# Patient Record
Sex: Female | Born: 1990 | Race: White | Hispanic: No | Marital: Single | Smoking: Never smoker
Health system: Southern US, Community
[De-identification: ages and names within clinical notes are randomized; demographics above are authoritative.]

## PROBLEM LIST (undated history)

## (undated) DIAGNOSIS — E559 Vitamin D deficiency, unspecified: Secondary | ICD-10-CM

## (undated) DIAGNOSIS — N809 Endometriosis, unspecified: Secondary | ICD-10-CM

## (undated) HISTORY — DX: Vitamin D deficiency, unspecified: E55.9

## (undated) HISTORY — PX: APPENDECTOMY: SHX54

## (undated) HISTORY — PX: BACK SURGERY: SHX140

## (undated) HISTORY — PX: EXCISION OF ENDOMETRIOMA: SHX6473

## (undated) HISTORY — DX: Endometriosis, unspecified: N80.9

---

## 2013-03-29 ENCOUNTER — Emergency Department (HOSPITAL_BASED_OUTPATIENT_CLINIC_OR_DEPARTMENT_OTHER)
Admission: EM | Admit: 2013-03-29 | Discharge: 2013-03-29 | Disposition: A | Discharge status: Home / Self Care | Payer: 59 | Attending: Emergency Medicine | Admitting: Emergency Medicine

## 2013-03-29 ENCOUNTER — Encounter (HOSPITAL_BASED_OUTPATIENT_CLINIC_OR_DEPARTMENT_OTHER): Payer: Self-pay | Admitting: Emergency Medicine

## 2013-03-29 ENCOUNTER — Emergency Department (HOSPITAL_BASED_OUTPATIENT_CLINIC_OR_DEPARTMENT_OTHER): Payer: 59

## 2013-03-29 DIAGNOSIS — Y929 Unspecified place or not applicable: Secondary | ICD-10-CM | POA: Insufficient documentation

## 2013-03-29 DIAGNOSIS — Y9389 Activity, other specified: Secondary | ICD-10-CM | POA: Insufficient documentation

## 2013-03-29 DIAGNOSIS — S93401A Sprain of unspecified ligament of right ankle, initial encounter: Secondary | ICD-10-CM

## 2013-03-29 DIAGNOSIS — S93409A Sprain of unspecified ligament of unspecified ankle, initial encounter: Secondary | ICD-10-CM | POA: Insufficient documentation

## 2013-03-29 DIAGNOSIS — Z79899 Other long term (current) drug therapy: Secondary | ICD-10-CM | POA: Insufficient documentation

## 2013-03-29 DIAGNOSIS — Z791 Long term (current) use of non-steroidal anti-inflammatories (NSAID): Secondary | ICD-10-CM | POA: Insufficient documentation

## 2013-03-29 DIAGNOSIS — X500XXA Overexertion from strenuous movement or load, initial encounter: Secondary | ICD-10-CM | POA: Insufficient documentation

## 2013-03-29 MED ORDER — IBUPROFEN 800 MG PO TABS
800.0000 mg | ORAL_TABLET | Freq: Once | ORAL | Status: AC
  Administered 2013-03-29: 800 mg via ORAL
  Filled 2013-03-29: qty 1

## 2013-03-29 MED ORDER — HYDROCODONE-ACETAMINOPHEN 5-325 MG PO TABS: 2.0000 | ORAL_TABLET | ORAL | Status: DC | PRN

## 2013-03-29 MED ORDER — HYDROCODONE-ACETAMINOPHEN 5-325 MG PO TABS
1.0000 | ORAL_TABLET | Freq: Once | ORAL | Status: AC
  Administered 2013-03-29: 1 via ORAL
  Filled 2013-03-29: qty 1

## 2013-03-29 MED ORDER — IBUPROFEN 800 MG PO TABS: 800.0000 mg | ORAL_TABLET | Freq: Three times a day (TID) | ORAL | Status: DC

## 2013-03-29 NOTE — ED Notes (Signed)
Recheck HR prior to discharge-92, regular.

## 2013-03-29 NOTE — ED Provider Notes (Signed)
CSN: [id number]     Arrival date & time 03/29/13  1849 History   First MD Initiated Contact with Patient 03/29/13 1957     Chief Complaint  Patient presents with  . Ankle Pain   (Consider location/radiation/quality/duration/timing/severity/associated sxs/prior Treatment) Patient is a 22 y.o. female presenting with ankle pain. The history is provided by the patient.  Ankle Pain Location:  Ankle Injury: yes   Ankle location:  R ankle Pain details:    Quality:  Aching   Radiates to:  Does not radiate   Severity:  Moderate   Onset quality:  Gradual   Timing:  Constant   Progression:  Worsening Chronicity:  New Dislocation: no   Foreign body present:  No foreign bodies Tetanus status:  Out of date Relieved by:  Nothing Worsened by:  Nothing tried Pt reports she turned foot sideways.   Pt complains of swelling and pain  History reviewed. No pertinent past medical history. Past Surgical History  Procedure Laterality Date  . Tonsillectomy     History reviewed. No pertinent family history. History  Substance Use Topics  . Smoking status: Never Smoker   . Smokeless tobacco: Not on file  . Alcohol Use: Yes     Comment: occ   OB History   Grav Para Term Preterm Abortions TAB SAB Ect Mult Living                 Review of Systems  All other systems reviewed and are negative.    Allergies  Review of patient's allergies indicates no known allergies.  Home Medications   Current Outpatient Rx  Name  Route  Sig  Dispense  Refill  . cefdinir (OMNICEF) 300 MG capsule   Oral   Take 300 mg by mouth 2 (two) times daily.         . medroxyPROGESTERone (DEPO-PROVERA) 150 MG/ML injection   Intramuscular   Inject 150 mg into the muscle every 3 (three) months.         . traZODone (DESYREL) 100 MG tablet   Oral   Take 25 mg by mouth at bedtime.         Marland Kitchen ibuprofen (ADVIL,MOTRIN) 800 MG tablet   Oral   Take 1 tablet (800 mg total) by mouth 3 (three) times daily.   21  tablet   0    BP 145/72  Pulse 123  Temp(Src) 98.7 F (37.1 C) (Oral)  Resp 18  SpO2 100% Physical Exam  Nursing note and vitals reviewed. Constitutional: She is oriented to person, place, and time. She appears well-developed and well-nourished.  HENT:  Head: Normocephalic.  Musculoskeletal: She exhibits tenderness.  Tender right ankle  nv and ns intact from  Neurological: She is alert and oriented to person, place, and time. She has normal reflexes.  Skin: Skin is warm.    ED Course  Procedures (including critical care time) Labs Review Labs Reviewed - No data to display Imaging Review Dg Ankle Complete Right  03/29/2013   CLINICAL DATA:  Right ankle injury and pain.  EXAM: RIGHT ANKLE - COMPLETE 3+ VIEW  COMPARISON:  None  FINDINGS: There is no evidence of acute fracture, subluxation, or dislocation.  No focal bony lesions are identified.  There is no evidence of radiopaque foreign body.  The joint spaces are unremarkable.  IMPRESSION: Negative.   Electronically Signed   By: Laveda Abbe M.D.   On: 03/29/2013 19:48    EKG Interpretation   None  MDM   1. Right ankle sprain, initial encounter    aso and crutches,      Elson Areas, PA-C 03/29/13 2036

## 2013-03-29 NOTE — ED Notes (Signed)
Right ankle "gave out" and foot was sideways, swelling noted to site

## 2013-03-29 NOTE — ED Provider Notes (Signed)
Medical screening examination/treatment/procedure(s) were performed by non-physician practitioner and as supervising physician I was immediately available for consultation/collaboration.  EKG Interpretation   None        Ethelda Chick, MD 03/29/13 2038

## 2014-11-18 IMAGING — CR DG ANKLE COMPLETE 3+V*R*
3 series · 3 of 3 positions shown · non-contrast
Comparison: None

CLINICAL DATA: Right ankle injury and pain.

EXAM:
RIGHT ANKLE - COMPLETE 3+ VIEW

[t ankle joint ap right]
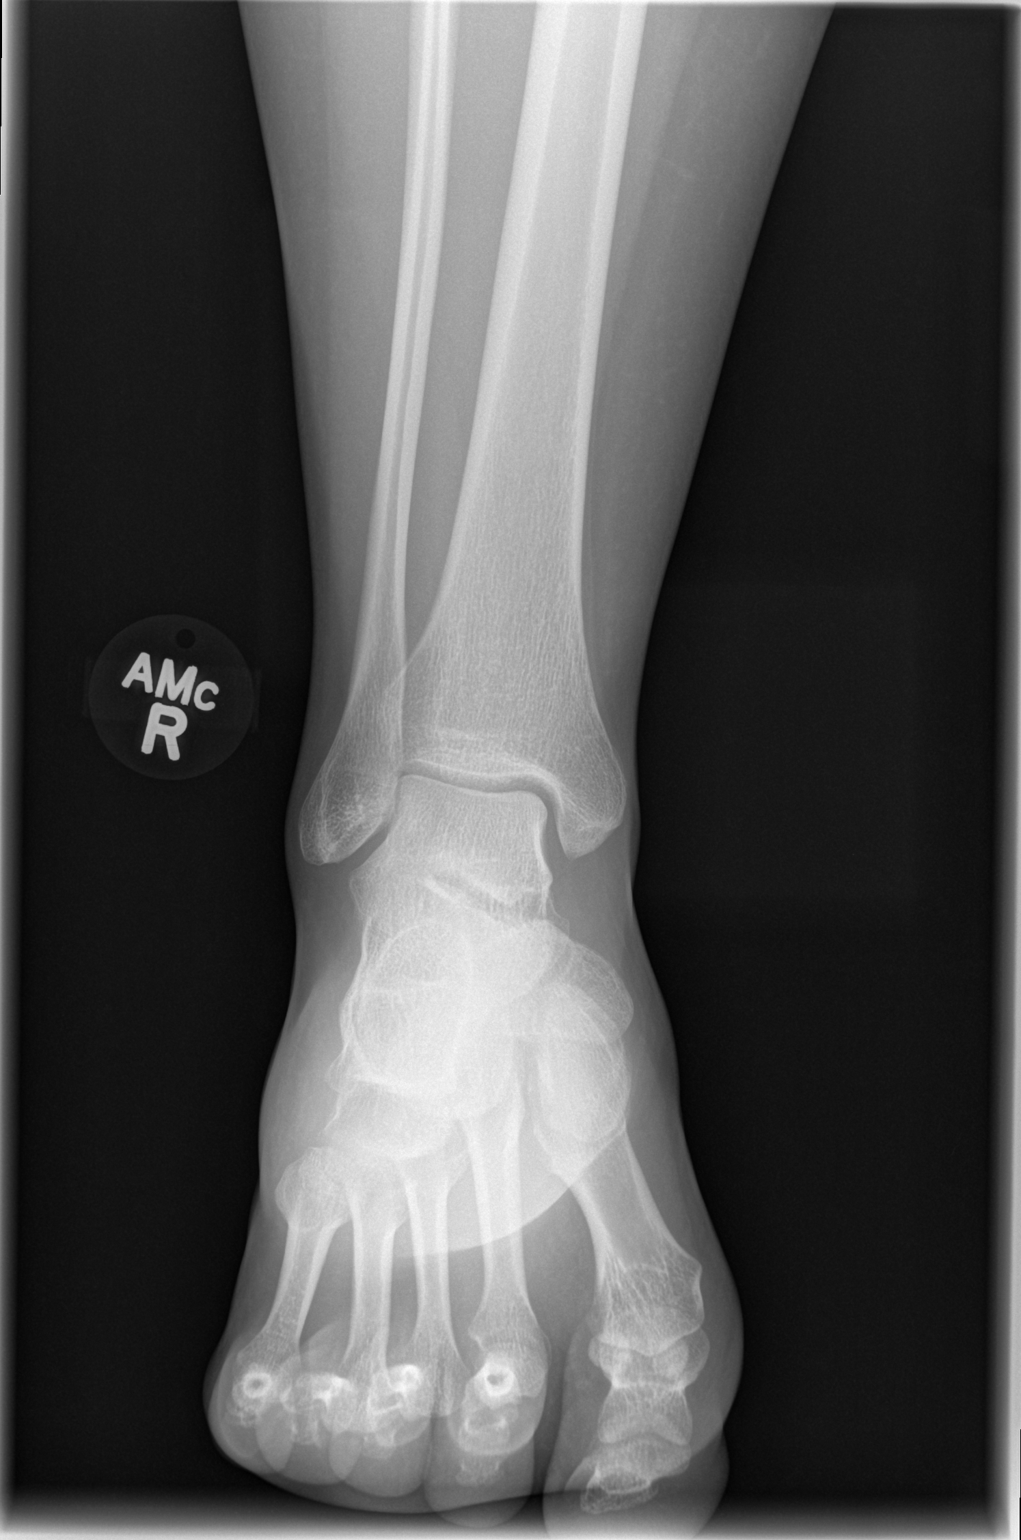

[t ankle joint oblique right]
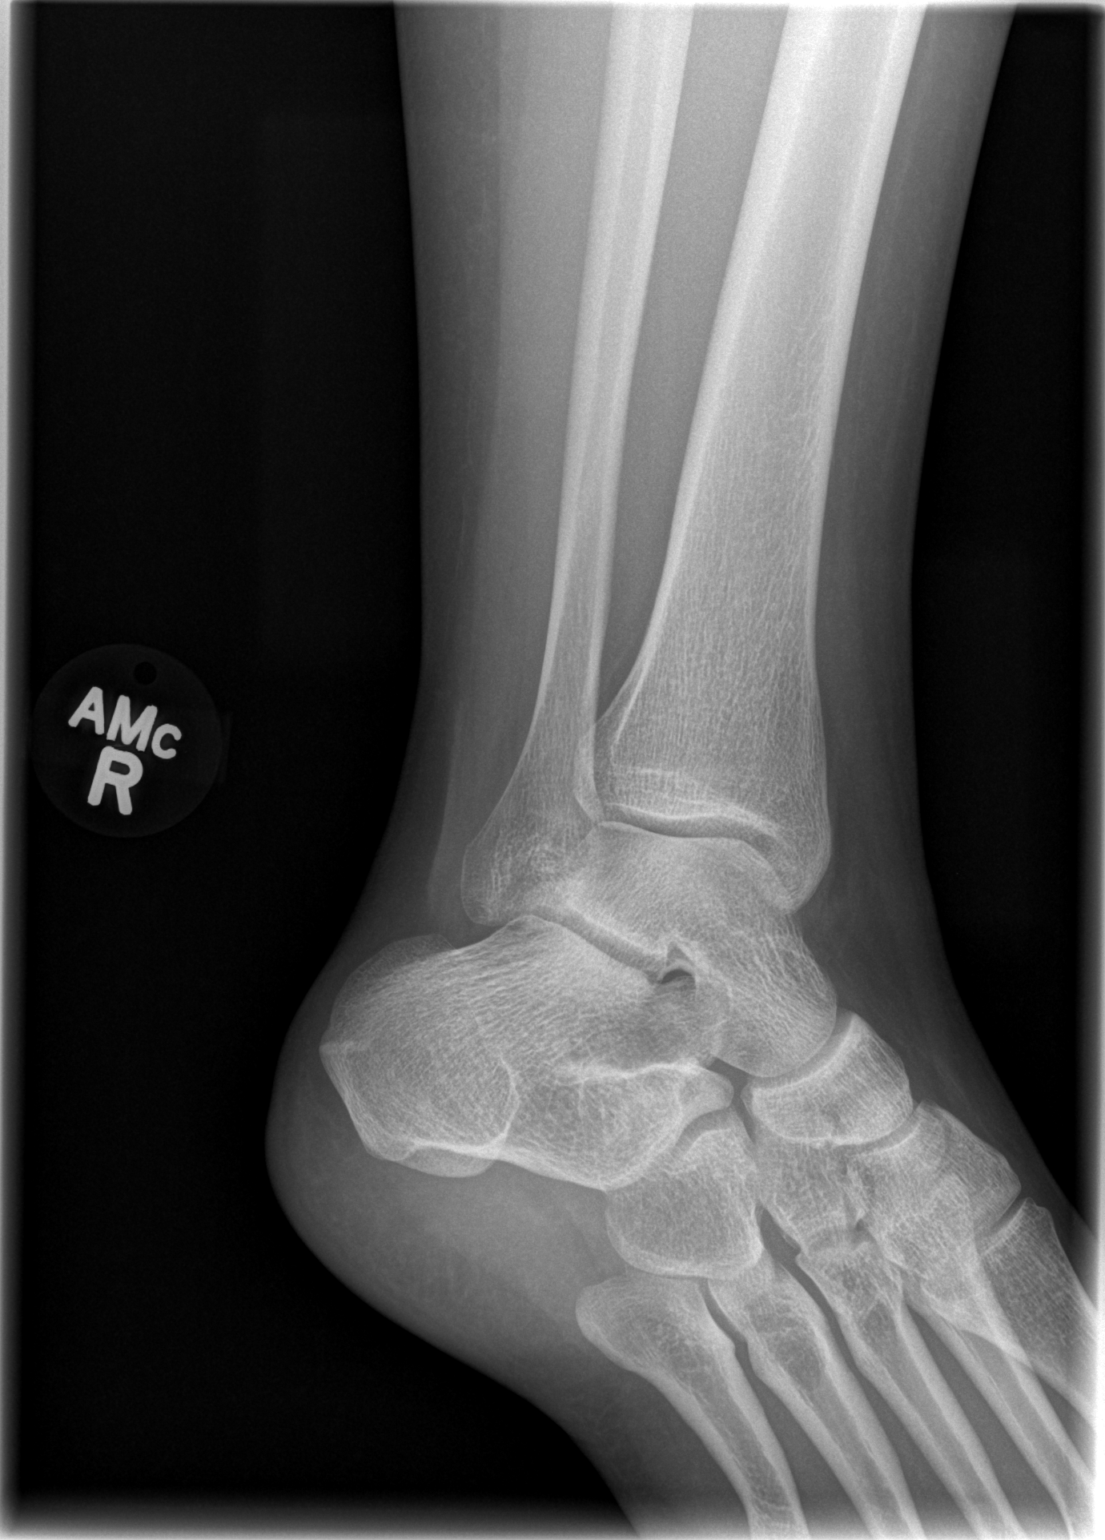

[t ankle joint lat right]
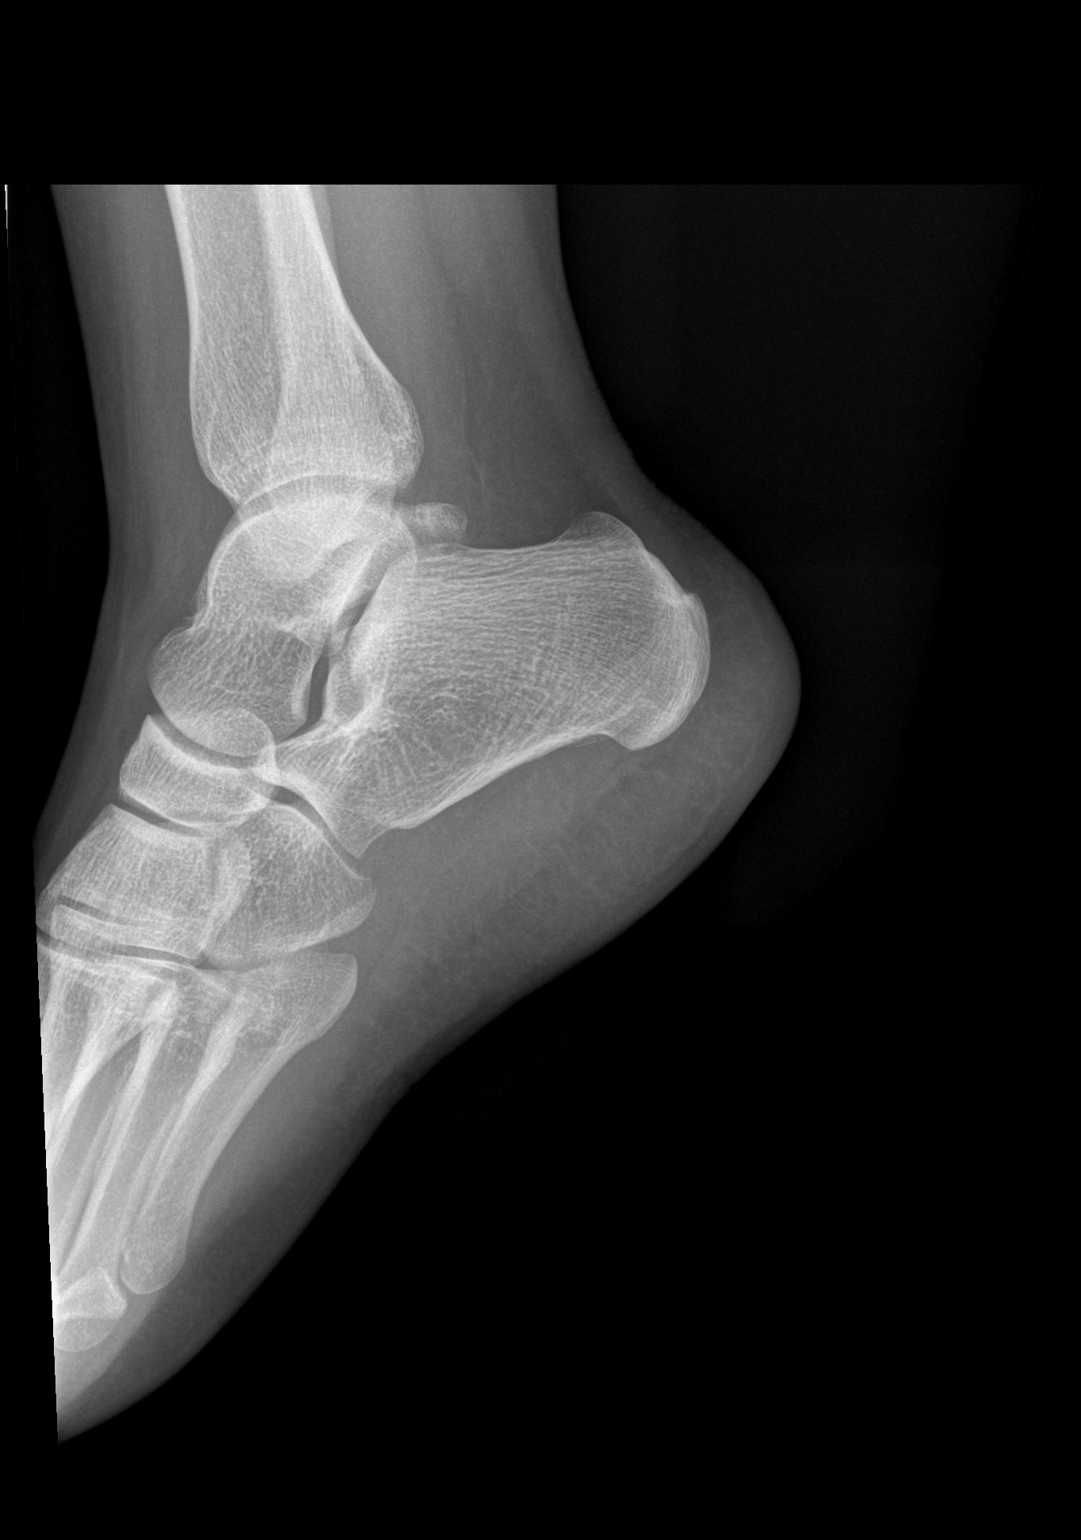

[3 of 3 positions shown; findings below may reference images not displayed]

FINDINGS: There is no evidence of acute fracture, subluxation, or dislocation.

No focal bony lesions are identified.

There is no evidence of radiopaque foreign body.

The joint spaces are unremarkable.
IMPRESSION: Negative.

## 2016-06-30 ENCOUNTER — Other Ambulatory Visit: Payer: Self-pay | Admitting: Family Medicine

## 2016-06-30 DIAGNOSIS — E041 Nontoxic single thyroid nodule: Secondary | ICD-10-CM | POA: Diagnosis not present

## 2016-06-30 DIAGNOSIS — Z13220 Encounter for screening for lipoid disorders: Secondary | ICD-10-CM | POA: Diagnosis not present

## 2016-06-30 DIAGNOSIS — E559 Vitamin D deficiency, unspecified: Secondary | ICD-10-CM | POA: Diagnosis not present

## 2016-06-30 DIAGNOSIS — Z7189 Other specified counseling: Secondary | ICD-10-CM | POA: Diagnosis not present

## 2016-06-30 DIAGNOSIS — Z79899 Other long term (current) drug therapy: Secondary | ICD-10-CM | POA: Diagnosis not present

## 2016-07-07 ENCOUNTER — Ambulatory Visit
Admission: RE | Admit: 2016-07-07 | Discharge: 2016-07-07 | Disposition: A | Discharge status: Home / Self Care | Payer: 59 | Source: Ambulatory Visit | Attending: Family Medicine | Admitting: Family Medicine

## 2016-07-07 DIAGNOSIS — E041 Nontoxic single thyroid nodule: Secondary | ICD-10-CM

## 2016-07-07 DIAGNOSIS — M542 Cervicalgia: Secondary | ICD-10-CM | POA: Diagnosis not present

## 2016-09-18 DIAGNOSIS — M5386 Other specified dorsopathies, lumbar region: Secondary | ICD-10-CM | POA: Diagnosis not present

## 2016-09-18 DIAGNOSIS — M9905 Segmental and somatic dysfunction of pelvic region: Secondary | ICD-10-CM | POA: Diagnosis not present

## 2016-09-18 DIAGNOSIS — M9903 Segmental and somatic dysfunction of lumbar region: Secondary | ICD-10-CM | POA: Diagnosis not present

## 2016-09-19 DIAGNOSIS — M5386 Other specified dorsopathies, lumbar region: Secondary | ICD-10-CM | POA: Diagnosis not present

## 2016-09-19 DIAGNOSIS — M9903 Segmental and somatic dysfunction of lumbar region: Secondary | ICD-10-CM | POA: Diagnosis not present

## 2016-09-19 DIAGNOSIS — M9905 Segmental and somatic dysfunction of pelvic region: Secondary | ICD-10-CM | POA: Diagnosis not present

## 2016-09-20 DIAGNOSIS — M5386 Other specified dorsopathies, lumbar region: Secondary | ICD-10-CM | POA: Diagnosis not present

## 2016-09-20 DIAGNOSIS — M9903 Segmental and somatic dysfunction of lumbar region: Secondary | ICD-10-CM | POA: Diagnosis not present

## 2016-09-20 DIAGNOSIS — M9905 Segmental and somatic dysfunction of pelvic region: Secondary | ICD-10-CM | POA: Diagnosis not present

## 2016-09-22 DIAGNOSIS — M9903 Segmental and somatic dysfunction of lumbar region: Secondary | ICD-10-CM | POA: Diagnosis not present

## 2016-09-22 DIAGNOSIS — M5386 Other specified dorsopathies, lumbar region: Secondary | ICD-10-CM | POA: Diagnosis not present

## 2016-09-22 DIAGNOSIS — M9905 Segmental and somatic dysfunction of pelvic region: Secondary | ICD-10-CM | POA: Diagnosis not present

## 2016-09-26 DIAGNOSIS — M9903 Segmental and somatic dysfunction of lumbar region: Secondary | ICD-10-CM | POA: Diagnosis not present

## 2016-09-26 DIAGNOSIS — M5386 Other specified dorsopathies, lumbar region: Secondary | ICD-10-CM | POA: Diagnosis not present

## 2016-09-26 DIAGNOSIS — M9905 Segmental and somatic dysfunction of pelvic region: Secondary | ICD-10-CM | POA: Diagnosis not present

## 2016-09-27 DIAGNOSIS — M5386 Other specified dorsopathies, lumbar region: Secondary | ICD-10-CM | POA: Diagnosis not present

## 2016-09-27 DIAGNOSIS — M9905 Segmental and somatic dysfunction of pelvic region: Secondary | ICD-10-CM | POA: Diagnosis not present

## 2016-09-27 DIAGNOSIS — M9903 Segmental and somatic dysfunction of lumbar region: Secondary | ICD-10-CM | POA: Diagnosis not present

## 2016-09-29 DIAGNOSIS — M9905 Segmental and somatic dysfunction of pelvic region: Secondary | ICD-10-CM | POA: Diagnosis not present

## 2016-09-29 DIAGNOSIS — M5386 Other specified dorsopathies, lumbar region: Secondary | ICD-10-CM | POA: Diagnosis not present

## 2016-09-29 DIAGNOSIS — M9903 Segmental and somatic dysfunction of lumbar region: Secondary | ICD-10-CM | POA: Diagnosis not present

## 2016-10-02 DIAGNOSIS — M9905 Segmental and somatic dysfunction of pelvic region: Secondary | ICD-10-CM | POA: Diagnosis not present

## 2016-10-02 DIAGNOSIS — M5386 Other specified dorsopathies, lumbar region: Secondary | ICD-10-CM | POA: Diagnosis not present

## 2016-10-02 DIAGNOSIS — M9903 Segmental and somatic dysfunction of lumbar region: Secondary | ICD-10-CM | POA: Diagnosis not present

## 2016-10-05 DIAGNOSIS — M5386 Other specified dorsopathies, lumbar region: Secondary | ICD-10-CM | POA: Diagnosis not present

## 2016-10-05 DIAGNOSIS — M9905 Segmental and somatic dysfunction of pelvic region: Secondary | ICD-10-CM | POA: Diagnosis not present

## 2016-10-05 DIAGNOSIS — M9903 Segmental and somatic dysfunction of lumbar region: Secondary | ICD-10-CM | POA: Diagnosis not present

## 2016-10-06 DIAGNOSIS — M9903 Segmental and somatic dysfunction of lumbar region: Secondary | ICD-10-CM | POA: Diagnosis not present

## 2016-10-06 DIAGNOSIS — M9905 Segmental and somatic dysfunction of pelvic region: Secondary | ICD-10-CM | POA: Diagnosis not present

## 2016-10-06 DIAGNOSIS — M5386 Other specified dorsopathies, lumbar region: Secondary | ICD-10-CM | POA: Diagnosis not present

## 2016-10-09 DIAGNOSIS — M5386 Other specified dorsopathies, lumbar region: Secondary | ICD-10-CM | POA: Diagnosis not present

## 2016-10-09 DIAGNOSIS — M9903 Segmental and somatic dysfunction of lumbar region: Secondary | ICD-10-CM | POA: Diagnosis not present

## 2016-10-09 DIAGNOSIS — M9905 Segmental and somatic dysfunction of pelvic region: Secondary | ICD-10-CM | POA: Diagnosis not present

## 2016-10-12 DIAGNOSIS — M5386 Other specified dorsopathies, lumbar region: Secondary | ICD-10-CM | POA: Diagnosis not present

## 2016-10-12 DIAGNOSIS — M9905 Segmental and somatic dysfunction of pelvic region: Secondary | ICD-10-CM | POA: Diagnosis not present

## 2016-10-12 DIAGNOSIS — M9903 Segmental and somatic dysfunction of lumbar region: Secondary | ICD-10-CM | POA: Diagnosis not present

## 2016-10-16 DIAGNOSIS — M9905 Segmental and somatic dysfunction of pelvic region: Secondary | ICD-10-CM | POA: Diagnosis not present

## 2016-10-16 DIAGNOSIS — M5386 Other specified dorsopathies, lumbar region: Secondary | ICD-10-CM | POA: Diagnosis not present

## 2016-10-16 DIAGNOSIS — M9903 Segmental and somatic dysfunction of lumbar region: Secondary | ICD-10-CM | POA: Diagnosis not present

## 2016-10-19 DIAGNOSIS — M9903 Segmental and somatic dysfunction of lumbar region: Secondary | ICD-10-CM | POA: Diagnosis not present

## 2016-10-19 DIAGNOSIS — M9905 Segmental and somatic dysfunction of pelvic region: Secondary | ICD-10-CM | POA: Diagnosis not present

## 2016-10-19 DIAGNOSIS — M5386 Other specified dorsopathies, lumbar region: Secondary | ICD-10-CM | POA: Diagnosis not present

## 2016-10-23 DIAGNOSIS — M5386 Other specified dorsopathies, lumbar region: Secondary | ICD-10-CM | POA: Diagnosis not present

## 2016-10-23 DIAGNOSIS — M9905 Segmental and somatic dysfunction of pelvic region: Secondary | ICD-10-CM | POA: Diagnosis not present

## 2016-10-23 DIAGNOSIS — M9903 Segmental and somatic dysfunction of lumbar region: Secondary | ICD-10-CM | POA: Diagnosis not present

## 2016-10-26 DIAGNOSIS — M9903 Segmental and somatic dysfunction of lumbar region: Secondary | ICD-10-CM | POA: Diagnosis not present

## 2016-10-26 DIAGNOSIS — M5386 Other specified dorsopathies, lumbar region: Secondary | ICD-10-CM | POA: Diagnosis not present

## 2016-10-26 DIAGNOSIS — M9905 Segmental and somatic dysfunction of pelvic region: Secondary | ICD-10-CM | POA: Diagnosis not present

## 2016-11-23 DIAGNOSIS — M9903 Segmental and somatic dysfunction of lumbar region: Secondary | ICD-10-CM | POA: Diagnosis not present

## 2016-11-23 DIAGNOSIS — M9905 Segmental and somatic dysfunction of pelvic region: Secondary | ICD-10-CM | POA: Diagnosis not present

## 2016-11-23 DIAGNOSIS — M5386 Other specified dorsopathies, lumbar region: Secondary | ICD-10-CM | POA: Diagnosis not present

## 2016-12-21 DIAGNOSIS — M9905 Segmental and somatic dysfunction of pelvic region: Secondary | ICD-10-CM | POA: Diagnosis not present

## 2016-12-21 DIAGNOSIS — M5386 Other specified dorsopathies, lumbar region: Secondary | ICD-10-CM | POA: Diagnosis not present

## 2016-12-21 DIAGNOSIS — M9903 Segmental and somatic dysfunction of lumbar region: Secondary | ICD-10-CM | POA: Diagnosis not present

## 2017-01-17 DIAGNOSIS — M5386 Other specified dorsopathies, lumbar region: Secondary | ICD-10-CM | POA: Diagnosis not present

## 2017-01-17 DIAGNOSIS — M9905 Segmental and somatic dysfunction of pelvic region: Secondary | ICD-10-CM | POA: Diagnosis not present

## 2017-01-17 DIAGNOSIS — M9903 Segmental and somatic dysfunction of lumbar region: Secondary | ICD-10-CM | POA: Diagnosis not present

## 2017-05-22 DIAGNOSIS — L680 Hirsutism: Secondary | ICD-10-CM | POA: Diagnosis not present

## 2017-05-22 DIAGNOSIS — Z01419 Encounter for gynecological examination (general) (routine) without abnormal findings: Secondary | ICD-10-CM | POA: Diagnosis not present

## 2017-07-24 DIAGNOSIS — O3680X0 Pregnancy with inconclusive fetal viability, not applicable or unspecified: Secondary | ICD-10-CM | POA: Diagnosis not present

## 2018-06-19 ENCOUNTER — Ambulatory Visit (INDEPENDENT_AMBULATORY_CARE_PROVIDER_SITE_OTHER): Payer: BLUE CROSS/BLUE SHIELD | Admitting: Psychiatry

## 2018-06-19 ENCOUNTER — Encounter: Payer: Self-pay | Admitting: Psychiatry

## 2018-06-19 VITALS — BP 135/82 | HR 80 | Ht 66.0 in | Wt 220.0 lb

## 2018-06-19 DIAGNOSIS — F332 Major depressive disorder, recurrent severe without psychotic features: Secondary | ICD-10-CM

## 2018-06-19 DIAGNOSIS — F99 Mental disorder, not otherwise specified: Secondary | ICD-10-CM | POA: Diagnosis not present

## 2018-06-19 DIAGNOSIS — F5105 Insomnia due to other mental disorder: Secondary | ICD-10-CM | POA: Diagnosis not present

## 2018-06-19 DIAGNOSIS — F411 Generalized anxiety disorder: Secondary | ICD-10-CM

## 2018-06-19 MED ORDER — CITALOPRAM HYDROBROMIDE 40 MG PO TABS: 40.0000 mg | ORAL_TABLET | Freq: Every day | ORAL | 0 refills | Status: DC

## 2018-06-19 MED ORDER — CITALOPRAM HYDROBROMIDE 20 MG PO TABS: ORAL_TABLET | ORAL | Status: DC

## 2018-06-19 NOTE — Progress Notes (Signed)
[facility] MD/PA/NP Initial Note  06/19/2018 4:17 PM Anna Obrien  MRN:  [id number]  Chief Complaint:  Chief Complaint    Depression; Insomnia; Anxiety    Depression, insomnia, and anxiety  HPI: Patient is a 28 year old female who presents for initial evaluation for treatment of depression, insomnia, and anxiety.  Patient reports, "I feel like I have been depressed my whole life" and likely since childhood. Reports chronic h/o insomnia that has not responded to OTC meds. Reports that she was not interested in any after school activities and would come home and do her homework and go to bed.  She also reports history of premenstrual dysphoric syndrome signs and symptoms and would have uncontrolled crying, mood swings, and irritability before her period. She reports that her obgyn started her on citalopram and this was effective for her PMDD signs and symptoms and she also noticed an improvement in insomnia.  She is unsure if citalopram was effective for mood and anxiety signs and symptoms that were not related to PMDD.  Patient reports that her therapist recommended her considering either an increase or change in medication and that patient spoke with her OB/GYN and OB/GYN and therapist recommended patient be seen by psychiatry.  Patient describes having persistent depressive signs and symptoms throughout most of her lifetime with a 5-6 month period of improved mood that ended when her father died.  She reports that at that time she was enjoying working out, making new friends, being more social, losing weight, and feeling more confident. Reports that she may have continued to have some mild depressive signs and symptoms at that time.  Patient reports worsening depression since November of 2019. Reports that mood has been persistently depressed. Denies current irritability. Reports feeling tired all the time and has low motivation to do things like showering and getting out of bed, or washing dishes.  Reports that she wants to work out and has trouble getting motivated to do this. Reports that she went on a 2 week trip to [place] and ended up spending time sleeping instead of sightseeing. Cancels plans she is looking forward to due to low motivation and low energy. Reports that she continues to gain weight despite wanting to lose weight. Reports excessive feelings of guilt due to not doing more. Reports constantly feeling tired. Falling asleep easier than ever and sleeping through the night. Reports that she has been having increased difficulty getting up and out of bed in the morning. Has been repeatedly hitting snooze and occasionally getting to work late. Reports that she goes to bed 9-10 pm and that she sleeps until about 7:30. Typically awakens once during the middle of the night and then feels as if she is "dozing" the rest of the night. Appetite has been increased and recognizes at times she is "emotionally eating." Reports binge eating maybe once a week. Denies h/o restriction or purging. She reports that her concentration "varies" and will either fixate on one thing and cannot focus on anything else or either cannot focus on things and "bouncing back and forth." Has been looking forward to visiting her sister "but now that it's coming up I am tired and wanting to stay at home." Denies SI. Reports sometimes wondering, "why am I here?"  Reports "having a hard time wanting to be with people" and having limited friends. Reports some anxiety in social situations and has difficulty knowing what to say to people she meets and "hates" public speaking.  Reports that she continuously "second guesses" herself.  Reports that she thinks of all the possible outcomes before making a decision and tries to avoid making a decision she will regret. Reports frequent catastrophic thinking. Reports some rumination. Reports that she isworried about not getting a good review at work despite having a good relationship with her  boss and exceeding job expectations. Denies physical s/s with anxiety other than increased hunger, "tired and foggy." Reports one panic attack when she was scuba diving. Denies any significant obsessions or compulsions.  Reports that she had some irritability in the past when sister would make "repeated sounds" such as tapping her finger nails, typically before her periods.   Denies periods of decreased need for sleep or excessive energy. Reports that she has periods of being "frugal" and then may buy a few things that she really wants but not to the point of causing financial hardship. Denies history of elevated mood.  Denies paranoia. Denies AH or VH.  Social HX: [place] Moved to  in middle school. Reports that her father's family is Jewish and that she was teased about her relationship. Reports chaotic childhood. Reports that parents fought frequently. Reports that parents separated. Had a strained relationship with her father and that their relationship was starting to improve right before his death. Had guilt about going to see a friend instead of her dad before his death. Has a sister that is 2 years younger that lives in [place]. Reports that she and he rmother were close growing up and now realizing that mother has been dishonest with her and "over-bearing." Mother lives locally. Now closer with sister. Went to school in [place]. 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 Also has some responsibilities with performance improvement.  Father died unexpectedly a couple of years ago. Reports that mother has been "against antidepressants." Sister and therapist are main supports. Reports breaking up with boyfriend of almost a year last week. Reporst that she is trying to read more and do paint by numbers. Reports that she enjoys being outside when it is sunny. Lives alone.   Visit Diagnosis:    ICD-10-CM   1. Severe episode of recurrent major depressive disorder,  without psychotic features (HCC) F33.2 citalopram (CELEXA) 40 MG tablet    citalopram (CELEXA) 20 MG tablet  2. Generalized anxiety disorder F41.1 citalopram (CELEXA) 40 MG tablet    citalopram (CELEXA) 20 MG tablet  3. Insomnia due to other mental disorder F51.05    F99     Past Psychiatric History: Has been seeing Edison PaceEmily Currence at [facility] since January 2019. Saw another a therapist a year before. Denies any other mental health tx aside from being prescribed Citalopram by obgyn for PMDD. Denies past psychiatric hospitalizations.   Past Psychiatric Medication Trials: Celexa- starting in early 2019 on 20 mg qd. Improved sleep and PMDD s/s. Unsure how helpful it has been for depression and anxiety in general. Trazodone- Excessive somnolence Melatonin- Nightmares OTC sleep aids- ineffective  Past Medical History:  Past Medical History:  Diagnosis Date  . Endometriosis   . Vitamin D deficiency     Past Surgical History:  Procedure Laterality Date  . APPENDECTOMY    . EXCISION OF ENDOMETRIOMA     Family History:  Family History  Problem Relation Age of Onset  . Depression Mother   . Diabetes Father   . Heart attack Father   . Anxiety disorder Sister   . Depression Sister     Social  History:  Social History   Socioeconomic History  . Marital status: Single    Spouse name: Not on file  . Number of children: Not on file  . Years of education: Not on file  . Highest education level: Not on file  Occupational History  . Not on file  Social Needs  . Financial resource strain: Not on file  . Food insecurity:    Worry: Not on file    Inability: Not on file  . Transportation needs:    Medical: Not on file    Non-medical: Not on file  Tobacco Use  . Smoking status: Never Smoker  . Smokeless tobacco: Never Used  Substance and Sexual Activity  . Alcohol use: Yes    Comment: occ  . Drug use: No  . Sexual activity: Not on file  Lifestyle  . Physical activity:     Days per week: Not on file    Minutes per session: Not on file  . Stress: Not on file  Relationships  . Social connections:    Talks on phone: Not on file    Gets together: Not on file    Attends religious service: Not on file    Active member of club or organization: Not on file    Attends meetings of clubs or organizations: Not on file    Relationship status: Not on file  Other Topics Concern  . Not on file  Social History Narrative  . Not on file    Allergies: No Known Allergies  Metabolic Disorder Labs: No results found for: HGBA1C, MPG No results found for: PROLACTIN No results found for: CHOL, TRIG, HDL, CHOLHDL, VLDL, LDLCALC No results found for: TSH  Therapeutic Level Labs: No results found for: LITHIUM No results found for: VALPROATE No components found for:  CBMZ  Current Medications: Current Outpatient Medications  Medication Sig Dispense Refill  . citalopram (CELEXA) 20 MG tablet Take 1.5 tablets daily for 5-7 days, then increase to 2 tablets daily.    . medroxyPROGESTERone (DEPO-PROVERA) 150 MG/ML injection Inject 150 mg into the muscle. q 10 weeks    . spironolactone (ALDACTONE) 50 MG tablet Take 50 mg by mouth daily.    . citalopram (CELEXA) 40 MG tablet Take 1 tablet (40 mg total) by mouth daily for 30 days. 30 tablet 0   No current facility-administered medications for this visit.     Medication Side Effects: none  Orders placed this visit:  No orders of the defined types were placed in this encounter.   Psychiatric Specialty Exam:  Review of Systems  Constitutional: Positive for malaise/fatigue.       Weight gain  Skin:       Reports hair loss and that it tends to increase during times of increased stress.   Endo/Heme/Allergies:       Reports upcoming physical exam and having yearly physical exams and labs have always been WNL.   Psychiatric/Behavioral: Positive for depression. The patient has insomnia.     Blood pressure 135/82, pulse  80, height 5\' 6"  (1.676 m), weight 220 lb (99.8 kg).Body mass index is 35.51 kg/m.  General Appearance: Casual  Eye Contact:  Fair  Speech:  Slow and Monotone  Volume:  Decreased  Mood:  Anxious and Depressed  Affect:  Blunt  Thought Process:  Coherent  Orientation:  Full (Time, Place, and Person)  Thought Content: Logical and Rumination   Suicidal Thoughts:  No  Homicidal Thoughts:  No  Memory:  WNL  Judgement:  Good  Insight:  Good  Psychomotor Activity:  Decreased  Concentration:  Concentration: Fair  Recall:  Good  Fund of Knowledge: Good  Language: Good  Assets:  Desire for Improvement Resilience Vocational/Educational  ADL's:  Intact  Cognition: WNL  Prognosis:  Good   Receiving Psychotherapy: Yes   Treatment Plan/Recommendations: Patient seen for 60 minutes and greater than 50% of visit spent counseling patient regarding mood and anxiety signs and symptoms.  Discussed that increase in citalopram to 40 mg daily may be helpful for her mood and anxiety signs and symptoms since she had a partial response in some mood and anxiety signs and symptoms with 20 mg dose and is currently tolerating citalopram 20 mg daily without any difficulty.  Discussed potential benefits, risk, and side effects of increasing citalopram to 40 mg daily to include potential risk of QT prolongation with higher doses of citalopram.  Discussed that if citalopram 40 mg daily is effective for her, may wish to consider having PCP perform EKG at time of yearly physical exam considering family history of sudden MI.  Discussed that she may want to initially take citalopram 20 mg 1-1/2 tabs daily for 5 to 7 days, then increase to 2 tablets daily since patient reports that she recently had 90-day prescription of citalopram 20 mg filled.  Will also send prescription for citalopram 40 mg daily to be put on file at patient's pharmacy to avoid her running out of medications prior to next appointment.  Discussed that it  would likely take 4 to 5 weeks to determine response to increased dose of citalopram. Patient advised to contact office with any questions, adverse effects, or acute worsening in signs and symptoms. Patient to follow-up with this provider in 4 to 5 weeks or sooner if clinically indicated.    Corie Chiquito, PMHNP

## 2018-07-24 ENCOUNTER — Other Ambulatory Visit: Payer: Self-pay

## 2018-07-24 ENCOUNTER — Ambulatory Visit (INDEPENDENT_AMBULATORY_CARE_PROVIDER_SITE_OTHER): Payer: BLUE CROSS/BLUE SHIELD | Admitting: Psychiatry

## 2018-07-24 ENCOUNTER — Encounter: Payer: Self-pay | Admitting: Psychiatry

## 2018-07-24 DIAGNOSIS — F99 Mental disorder, not otherwise specified: Secondary | ICD-10-CM

## 2018-07-24 DIAGNOSIS — F339 Major depressive disorder, recurrent, unspecified: Secondary | ICD-10-CM

## 2018-07-24 DIAGNOSIS — F5105 Insomnia due to other mental disorder: Secondary | ICD-10-CM | POA: Diagnosis not present

## 2018-07-24 DIAGNOSIS — F411 Generalized anxiety disorder: Secondary | ICD-10-CM | POA: Diagnosis not present

## 2018-07-24 MED ORDER — CITALOPRAM HYDROBROMIDE 40 MG PO TABS: 40.0000 mg | ORAL_TABLET | Freq: Every day | ORAL | 0 refills | Status: DC

## 2018-07-24 NOTE — Progress Notes (Signed)
Anna Obrien [id number] Aug 04, 1990 28 y.o.  Subjective:   [name] is a 28 y.o. (DOB 1990-05-13) female.  Virtual Visit via Telephone Note  I connected with pt by telephone and verified that I am speaking with the correct person using two identifiers.   I discussed the limitations, risks, security and privacy concerns of performing an evaluation and management service by telephone and the availability of in person appointments. I also discussed with the patient that there may be a patient responsible charge related to this service. The patient expressed understanding and agreed to proceed.  I discussed the assessment and treatment plan with the patient. The patient was provided an opportunity to ask questions and all were answered. The patient agreed with the plan and demonstrated an understanding of the instructions.   The patient was advised to call back or seek an in-person evaluation if the symptoms worsen or if the condition fails to improve as anticipated.  I provided 25 minutes of non-face-to-face time during this encounter. The call started at 11:45 am and ended at 12:15 pm. The patient was located at home and the provider was located at [facility].  Chief Complaint:  Chief Complaint  Patient presents with  . Depression  . Anxiety    HPI Anna Obrien presents for telephone visit for follow-up of anxiety and depression. She reports that she initially did not notice any changes until the last 1-2 weeks when she started exercising and started dating someone new. 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 "It's really stressful being on site with trying to do 5 days of work while in the lab." Reports that there were 2 confirmed cases of COVID 19 at her workplace. Reports that she has had increased anxiety since last Friday when work schedule changed  and concerns about virus. Denies any recent panic attacks. She reports some decrease in worry and "I feel like I have been able to make decisions easier" because that she is not thinking about all the pros and cons of every option. Denies irritability. She reports that her PMDD s/s remain well controlled. Reports that she continues to have some social anxiety "but I don't think it is as bad." Reports that she has been doing group exercise and has been ok with dating someone new.   She reports that her sleep is "not bad, juts different. Dreaming even more now." Reports that sleep has been less restful with COVID 19. Estimates sleeping at least 8 hours a night. She reports that her energy and motivation have significantly improved. Reports that she has worked out some in the morning before work and has not done this in the past. Reports some recent emotional eating with working more from home with COVID 19. Reports one episode of binging last weekend and has not had any binge eating this week. Notices some possible improvement in concentration and getting more done at work. Denies SI.   Reports prior to 2 weeks ago was self-isolating, not wanting to get up, tired frequently.   Past Psychiatric Medication Trials: Celexa- starting in early 2019 on 20 mg qd. Improved sleep and PMDD s/s. Unsure how helpful it has been for depression and anxiety in general. Trazodone- Excessive somnolence Melatonin- Nightmares OTC sleep aids- ineffective   Review of Systems:  Review of Systems  Gastrointestinal: Negative.   Genitourinary:  Has upcoming LEEP procedure for dysplasia.   Neurological: Negative for tremors.  Psychiatric/Behavioral:       Please refer to HPI    Medications: I have reviewed the patient's current medications.  Current Outpatient Medications  Medication Sig Dispense Refill  . medroxyPROGESTERone (DEPO-PROVERA) 150 MG/ML injection Inject 150 mg into the muscle. q 10 weeks    .  spironolactone (ALDACTONE) 50 MG tablet Take 50 mg by mouth daily.    . citalopram (CELEXA) 40 MG tablet Take 1 tablet (40 mg total) by mouth daily. 90 tablet 0   No current facility-administered medications for this visit.     Medication Side Effects: None  Allergies: No Known Allergies  Past Medical History:  Diagnosis Date  . Endometriosis   . Vitamin D deficiency     Family History  Problem Relation Age of Onset  . Depression Mother   . Diabetes Father   . Heart attack Father   . Anxiety disorder Sister   . Depression Sister     Social History   Socioeconomic History  . Marital status: Single    Spouse name: Not on file  . Number of children: Not on file  . Years of education: Not on file  . Highest education level: Not on file  Occupational History  . Not on file  Social Needs  . Financial resource strain: Not on file  . Food insecurity:    Worry: Not on file    Inability: Not on file  . Transportation needs:    Medical: Not on file    Non-medical: Not on file  Tobacco Use  . Smoking status: Never Smoker  . Smokeless tobacco: Never Used  Substance and Sexual Activity  . Alcohol use: Yes    Comment: occ  . Drug use: No  . Sexual activity: Not on file  Lifestyle  . Physical activity:    Days per week: Not on file    Minutes per session: Not on file  . Stress: Not on file  Relationships  . Social connections:    Talks on phone: Not on file    Gets together: Not on file    Attends religious service: Not on file    Active member of club or organization: Not on file    Attends meetings of clubs or organizations: Not on file    Relationship status: Not on file  . Intimate partner violence:    Fear of current or ex partner: Not on file    Emotionally abused: Not on file    Physically abused: Not on file    Forced sexual activity: Not on file  Other Topics Concern  . Not on file  Social History Narrative  . Not on file    Past Medical History,  Surgical history, Social history, and Family history were reviewed and updated as appropriate.   Please see review of systems for further details on the patient's review from today.   Objective:   Physical Exam:  There were no vitals taken for this visit.  Physical Exam Neurological:     Mental Status: She is alert and oriented to person, place, and time.  Psychiatric:        Attention and Perception: Attention normal.        Mood and Affect: Mood normal.        Speech: Speech normal.        Behavior: Behavior is cooperative.        Thought Content: Thought  content normal.        Cognition and Memory: Cognition and memory normal.        Judgment: Judgment normal.     Lab Review:  No results found for: NA, K, CL, CO2, GLUCOSE, BUN, CREATININE, CALCIUM, PROT, ALBUMIN, AST, ALT, ALKPHOS, BILITOT, GFRNONAA, GFRAA  No results found for: WBC, RBC, HGB, HCT, PLT, MCV, MCH, MCHC, RDW, LYMPHSABS, MONOABS, EOSABS, BASOSABS  No results found for: POCLITH, LITHIUM   No results found for: PHENYTOIN, PHENOBARB, VALPROATE, CBMZ   .res Assessment: Plan:   Discussed treatment plan and agreed to continue Citalopram 40 mg po qd for mood and anxiety since s/s have improved since increase in Citalopram and more time is needed to determine response.    Generalized anxiety disorder - Plan: citalopram (CELEXA) 40 MG tablet  Severe episode of recurrent major depressive disorder, without psychotic features (HCC) - Plan: citalopram (CELEXA) 40 MG tablet  Insomnia due to other mental disorder  Please see After Visit Summary for patient specific instructions.  Future Appointments  Date Time Provider Department Center  09/04/2018  1:00 PM Corie Chiquito, PMHNP [facility] None    No orders of the defined types were placed in this encounter.     -------------------------------

## 2018-09-04 ENCOUNTER — Ambulatory Visit (INDEPENDENT_AMBULATORY_CARE_PROVIDER_SITE_OTHER): Payer: BLUE CROSS/BLUE SHIELD | Admitting: Psychiatry

## 2018-09-04 ENCOUNTER — Other Ambulatory Visit: Payer: Self-pay

## 2018-09-04 ENCOUNTER — Encounter: Payer: Self-pay | Admitting: Psychiatry

## 2018-09-04 DIAGNOSIS — F411 Generalized anxiety disorder: Secondary | ICD-10-CM

## 2018-09-04 DIAGNOSIS — F339 Major depressive disorder, recurrent, unspecified: Secondary | ICD-10-CM

## 2018-09-04 DIAGNOSIS — F5105 Insomnia due to other mental disorder: Secondary | ICD-10-CM | POA: Diagnosis not present

## 2018-09-04 DIAGNOSIS — F99 Mental disorder, not otherwise specified: Secondary | ICD-10-CM

## 2018-09-04 MED ORDER — CITALOPRAM HYDROBROMIDE 40 MG PO TABS: 40.0000 mg | ORAL_TABLET | Freq: Every day | ORAL | 0 refills | Status: DC

## 2018-09-04 NOTE — Progress Notes (Signed)
Anna Obrien [id number] [birth year] 28 y.o.  Virtual Visit via Telephone Note  I connected with pt on 09/04/18 at  1:00 PM EDT by telephone and verified that I am speaking with the correct person using two identifiers.   I discussed the limitations, risks, security and privacy concerns of performing an evaluation and management service by telephone and the availability of in person appointments. I also discussed with the patient that there may be a patient responsible charge related to this service. The patient expressed understanding and agreed to proceed.   I discussed the assessment and treatment plan with the patient. The patient was provided an opportunity to ask questions and all were answered. The patient agreed with the plan and demonstrated an understanding of the instructions.   The patient was advised to call back or seek an in-person evaluation if the symptoms worsen or if the condition fails to improve as anticipated.  I provided 30 minutes of non-face-to-face time during this encounter.  The patient was located at home.  The provider was located at home.   Corie ChiquitoJessica Ahmet Schank, PMHNP   Subjective:   [name] is a 28 y.o. (DOB [birth year]) female.  Chief Complaint:  Chief Complaint  Patient presents with  . Anxiety  . Depression    HPI Anna Stagerlexis Gottsch presents for follow-up of depression and anxiety. She reports that she is having more vivid dreams most nights and does not feel rested. Reports that she is falling asleep faster. Awakens at 3-5 am and then sleep is restless afterwards. Sleeping about 9-9.5 hours.   She reports that she is now working nights and weekends (Mon and Tues 7P-7A and from home Wed and Thursday, and another rotating day). Reports that she has also had more work responsibilities outside of work hours. Reports limited downtime away from work.  She reports that she has had some increase in anxiety with stress. Denies any recent panic attacks.  Reports that she had one incident where she felt overwhelmed and tearful. She reports that her mood has been better "most of the time." She reports that she has low energy and motivation. Appetite has been irregular. Reports that her app has been variable since having the flu. Describes concentration as "ok" with periods of hyper-focus alternating with distractibility. Denies SI.   Mother has been laid off and pt reports that she will be helping her mother financially.   Currently taking Citalopram in the morning.    Past Psychiatric Medication Trials: Celexa- starting in early 2019 on 20 mg qd. Improved sleep and PMDD s/s. Unsure how helpful it has been for depression and anxiety in general. Trazodone- Excessive somnolence Melatonin- Nightmares OTC sleep aids- ineffective   Review of Systems:  Review of Systems  Constitutional:       Reports that she has had the flu and strep throat in the last few weeks.  Genitourinary:       Had LEEP procedure.    Medications: I have reviewed the patient's current medications.  Current Outpatient Medications  Medication Sig Dispense Refill  . citalopram (CELEXA) 40 MG tablet Take 1 tablet (40 mg total) by mouth daily. 90 tablet 0  . medroxyPROGESTERone (DEPO-PROVERA) 150 MG/ML injection Inject 150 mg into the muscle. q 10 weeks    . spironolactone (ALDACTONE) 50 MG tablet Take 50 mg by mouth daily.     No current facility-administered medications for this visit.     Medication Side Effects: Other: Vivid dreams  Allergies: No  Known Allergies  Past Medical History:  Diagnosis Date  . Endometriosis   . Vitamin D deficiency     Family History  Problem Relation Age of Onset  . Depression Mother   . Diabetes Father   . Heart attack Father   . Anxiety disorder Sister   . Depression Sister     Social History   Socioeconomic History  . Marital status: Single    Spouse name: Not on file  . Number of children: Not on file  . Years  of education: Not on file  . Highest education level: Not on file  Occupational History  . Not on file  Social Needs  . Financial resource strain: Not on file  . Food insecurity:    Worry: Not on file    Inability: Not on file  . Transportation needs:    Medical: Not on file    Non-medical: Not on file  Tobacco Use  . Smoking status: Never Smoker  . Smokeless tobacco: Never Used  Substance and Sexual Activity  . Alcohol use: Yes    Comment: occ  . Drug use: No  . Sexual activity: Not on file  Lifestyle  . Physical activity:    Days per week: Not on file    Minutes per session: Not on file  . Stress: Not on file  Relationships  . Social connections:    Talks on phone: Not on file    Gets together: Not on file    Attends religious service: Not on file    Active member of club or organization: Not on file    Attends meetings of clubs or organizations: Not on file    Relationship status: Not on file  . Intimate partner violence:    Fear of current or ex partner: Not on file    Emotionally abused: Not on file    Physically abused: Not on file    Forced sexual activity: Not on file  Other Topics Concern  . Not on file  Social History Narrative  . Not on file    Past Medical History, Surgical history, Social history, and Family history were reviewed and updated as appropriate.   Please see review of systems for further details on the patient's review from today.   Objective:   Physical Exam:  There were no vitals taken for this visit.  Physical Exam Neurological:     Mental Status: She is alert and oriented to person, place, and time.     Cranial Nerves: No dysarthria.  Psychiatric:        Attention and Perception: Attention normal.        Mood and Affect: Mood is depressed.        Speech: Speech normal.        Behavior: Behavior is cooperative.        Thought Content: Thought content normal. Thought content is not paranoid or delusional. Thought content does  not include homicidal or suicidal ideation. Thought content does not include homicidal or suicidal plan.        Cognition and Memory: Cognition and memory normal.        Judgment: Judgment normal.     Lab Review:  No results found for: NA, K, CL, CO2, GLUCOSE, BUN, CREATININE, CALCIUM, PROT, ALBUMIN, AST, ALT, ALKPHOS, BILITOT, GFRNONAA, GFRAA  No results found for: WBC, RBC, HGB, HCT, PLT, MCV, MCH, MCHC, RDW, LYMPHSABS, MONOABS, EOSABS, BASOSABS  No results found for: POCLITH, LITHIUM   No results  found for: PHENYTOIN, PHENOBARB, VALPROATE, CBMZ   .res Assessment: Plan:   Discussed treatment options with patient to include continuing current plan of care or considering switching citalopram to another SSRI or SNRI.  Patient reports that she would like to continue citalopram 40 mg at this time since some recent anxiety and depression may be due to situational stressors and recent physical illness.  Agree with plan to follow-up in approximately 6 weeks to reevaluate and to consider changing medication at that time if mood and anxiety have not improved.  Patient advised to contact office if signs and symptoms worsen or she feels a change in medication is needed sooner.   Generalized anxiety disorder - Plan: citalopram (CELEXA) 40 MG tablet  Recurrent major depressive disorder, remission status unspecified (HCC)  Insomnia due to other mental disorder  Please see After Visit Summary for patient specific instructions.  No future appointments.  No orders of the defined types were placed in this encounter.     -------------------------------

## 2018-12-26 ENCOUNTER — Other Ambulatory Visit: Payer: Self-pay | Admitting: Psychiatry

## 2018-12-26 DIAGNOSIS — F411 Generalized anxiety disorder: Secondary | ICD-10-CM

## 2018-12-26 NOTE — Telephone Encounter (Signed)
Nothing scheduled last visit 05/06

## 2019-03-12 ENCOUNTER — Other Ambulatory Visit: Payer: Self-pay | Admitting: Psychiatry

## 2019-03-12 DIAGNOSIS — F411 Generalized anxiety disorder: Secondary | ICD-10-CM

## 2019-04-17 ENCOUNTER — Ambulatory Visit (INDEPENDENT_AMBULATORY_CARE_PROVIDER_SITE_OTHER): Payer: BC Managed Care – PPO | Admitting: Psychiatry

## 2019-04-17 ENCOUNTER — Encounter: Payer: Self-pay | Admitting: Psychiatry

## 2019-04-17 DIAGNOSIS — F411 Generalized anxiety disorder: Secondary | ICD-10-CM | POA: Diagnosis not present

## 2019-04-17 DIAGNOSIS — F339 Major depressive disorder, recurrent, unspecified: Secondary | ICD-10-CM | POA: Diagnosis not present

## 2019-04-17 MED ORDER — BUPROPION HCL ER (XL) 150 MG PO TB24: 150.0000 mg | ORAL_TABLET | Freq: Every day | ORAL | 1 refills | Status: DC

## 2019-04-17 MED ORDER — CITALOPRAM HYDROBROMIDE 40 MG PO TABS: 40.0000 mg | ORAL_TABLET | Freq: Every day | ORAL | 1 refills | Status: DC

## 2019-04-17 NOTE — Progress Notes (Signed)
Anna Obrien [id number] Jul 18, 1990 28 y.o.  Virtual Visit via Telephone Note  I connected with pt on 04/17/19 at 11:00 AM EST by telephone and verified that I am speaking with the correct person using two identifiers.   I discussed the limitations, risks, security and privacy concerns of performing an evaluation and management service by telephone and the availability of in person appointments. I also discussed with the patient that there may be a patient responsible charge related to this service. The patient expressed understanding and agreed to proceed.   I discussed the assessment and treatment plan with the patient. The patient was provided an opportunity to ask questions and all were answered. The patient agreed with the plan and demonstrated an understanding of the instructions.   The patient was advised to call back or seek an in-person evaluation if the symptoms worsen or if the condition fails to improve as anticipated.  I provided 25 minutes of non-face-to-face time during this encounter.  The patient was located at home.  [facility]   Corie ChiquitoJessica Bronislaw Switzer, PMHNP   Subjective:   Patient ID:  Anna Stagerlexis Caetano is a 28 y.o. (DOB Jul 18, 1990) female.  Chief Complaint:  Chief Complaint  Patient presents with  . Depression  . Anxiety    HPI Anna Stagerlexis Belfiore presents for follow-up of anxiety and depression. She reports that she has been going to the gym, showering regularly, working, and seeing her therapist regularly. She reports "overall things are getting better over time." She reports that she continues to have some depression "but it is decreasing but I am starting to have more good days." She reports occ days where she has lower mood and decreased energy and motivation, about 2-3 times a month. She reports that this tends to happen after she has done more things socially. She reports continued anxiety and that this has improved overall. Reports that  she is learning how to recognize and manage anxiety. She reports that she has had 2 panic attacks, once with a health scare and once during a therapy session.   She reports that vivid dreams have decreased and typically only occur when she is stressed. Has been trying to go to bed between 10-11 pm and awakens around 7 am. Appetite has been level and reports that she has been trying to eat healthier. She reports occ binge eating in times of increased stress and that this has been occurring less overall. She reports that her energy and motivation are low at baseline with periods of worsening. Reports that she has to force herself to do some things. She reports that she has things she enjoys some things and sometimes wants to do things mentally but not physically. She reports that her concentration and focus has been worse with working from home. Denies SI.  She reports that she has infrequent some mood swings around the time she would have a period about once a month.   Has been working half the time from home and half the time on site. She reports that work is going well and she was recently promoted.   Learned a friend's father died and this triggered thoughts and memories about losing her father suddenly.   In the process of buying her first house and reports that she is excited about this.   Never had a seizure.   Past Psychiatric Medication Trials: Celexa- starting in early 2019 on 20 mg qd. Improved sleep and PMDD s/s. Unsure how helpful it has  been for depression and anxiety in general. Trazodone- Excessive somnolence Melatonin- Nightmares OTC sleep aids- ineffective   Review of Systems:  Review of Systems  Gastrointestinal: Negative.   Musculoskeletal: Negative for gait problem.  Neurological: Negative for tremors.       Occ HA's if she forgot to take medication  Psychiatric/Behavioral:       Please refer to HPI    Medications: I have reviewed the patient's current  medications.  Current Outpatient Medications  Medication Sig Dispense Refill  . citalopram (CELEXA) 40 MG tablet Take 1 tablet (40 mg total) by mouth daily. 90 tablet 1  . medroxyPROGESTERone (DEPO-PROVERA) 150 MG/ML injection Inject 150 mg into the muscle. q 10 weeks    . spironolactone (ALDACTONE) 50 MG tablet Take 50 mg by mouth daily.    Marland Kitchen buPROPion (WELLBUTRIN XL) 150 MG 24 hr tablet Take 1 tablet (150 mg total) by mouth daily. 30 tablet 1   No current facility-administered medications for this visit.    Medication Side Effects: None  Allergies: No Known Allergies  Past Medical History:  Diagnosis Date  . Endometriosis   . Vitamin D deficiency     Family History  Problem Relation Age of Onset  . Depression Mother   . Diabetes Father   . Heart attack Father   . Anxiety disorder Sister   . Depression Sister     Social History   Socioeconomic History  . Marital status: Single    Spouse name: Not on file  . Number of children: Not on file  . Years of education: Not on file  . Highest education level: Not on file  Occupational History  . Not on file  Tobacco Use  . Smoking status: Never Smoker  . Smokeless tobacco: Never Used  Substance and Sexual Activity  . Alcohol use: Yes    Comment: occ  . Drug use: No  . Sexual activity: Not on file  Other Topics Concern  . Not on file  Social History Narrative  . Not on file   Social Determinants of Health   Financial Resource Strain:   . Difficulty of Paying Living Expenses: Not on file  Food Insecurity:   . Worried About Charity fundraiser in the Last Year: Not on file  . Ran Out of Food in the Last Year: Not on file  Transportation Needs:   . Lack of Transportation (Medical): Not on file  . Lack of Transportation (Non-Medical): Not on file  Physical Activity:   . Days of Exercise per Week: Not on file  . Minutes of Exercise per Session: Not on file  Stress:   . Feeling of Stress : Not on file  Social  Connections:   . Frequency of Communication with Friends and Family: Not on file  . Frequency of Social Gatherings with Friends and Family: Not on file  . Attends Religious Services: Not on file  . Active Member of Clubs or Organizations: Not on file  . Attends Archivist Meetings: Not on file  . Marital Status: Not on file  Intimate Partner Violence:   . Fear of Current or Ex-Partner: Not on file  . Emotionally Abused: Not on file  . Physically Abused: Not on file  . Sexually Abused: Not on file    Past Medical History, Surgical history, Social history, and Family history were reviewed and updated as appropriate.   Please see review of systems for further details on the patient's review from today.  Objective:   Physical Exam:  There were no vitals taken for this visit.  Physical Exam Neurological:     Mental Status: She is alert and oriented to person, place, and time.     Cranial Nerves: No dysarthria.  Psychiatric:        Attention and Perception: Attention and perception normal.        Mood and Affect: Mood is depressed.        Speech: Speech normal.        Behavior: Behavior is cooperative.        Thought Content: Thought content normal. Thought content is not paranoid or delusional. Thought content does not include homicidal or suicidal ideation. Thought content does not include homicidal or suicidal plan.        Cognition and Memory: Cognition and memory normal.        Judgment: Judgment normal.     Comments: Insight intact     Lab Review:  No results found for: NA, K, CL, CO2, GLUCOSE, BUN, CREATININE, CALCIUM, PROT, ALBUMIN, AST, ALT, ALKPHOS, BILITOT, GFRNONAA, GFRAA  No results found for: WBC, RBC, HGB, HCT, PLT, MCV, MCH, MCHC, RDW, LYMPHSABS, MONOABS, EOSABS, BASOSABS  No results found for: POCLITH, LITHIUM   No results found for: PHENYTOIN, PHENOBARB, VALPROATE, CBMZ   .res Assessment: Plan:   Pt seen for 30 minutes and greater than 50% of  session spent counseling pt re: options to improve depression to include augmentation with Wellbutrin XL 150 mg po q am for depression. Discussed potential benefits, risks, and side effects of Wellbutrin XL. Pt agrees to starting Wellbutrin XL. Start Wellbutrin XL 150 mg po q am for depression.  Discussed re-starting Vitamin D to improve mood and energy. Continue Celexa 40 mg po qd for anxiety and PMDD. Pt to f/u in 4 weeks or sooner if clinically indicated.  Patient advised to contact office with any questions, adverse effects, or acute worsening in signs and symptoms.  Toma was seen today for depression and anxiety.  Diagnoses and all orders for this visit:  Recurrent major depressive disorder, remission status unspecified (HCC) -     buPROPion (WELLBUTRIN XL) 150 MG 24 hr tablet; Take 1 tablet (150 mg total) by mouth daily.  Generalized anxiety disorder -     citalopram (CELEXA) 40 MG tablet; Take 1 tablet (40 mg total) by mouth daily.    Please see After Visit Summary for patient specific instructions.  Future Appointments  Date Time Provider Department Center  05/15/2019 11:30 AM Corie Chiquito, PMHNP [facility] None    No orders of the defined types were placed in this encounter.     -------------------------------

## 2019-05-13 ENCOUNTER — Other Ambulatory Visit: Payer: Self-pay | Admitting: Psychiatry

## 2019-05-13 DIAGNOSIS — F339 Major depressive disorder, recurrent, unspecified: Secondary | ICD-10-CM

## 2019-05-13 NOTE — Telephone Encounter (Signed)
Apt tomorrow 05/15/2019 new start

## 2019-05-15 ENCOUNTER — Encounter: Payer: Self-pay | Admitting: Psychiatry

## 2019-05-15 ENCOUNTER — Ambulatory Visit (INDEPENDENT_AMBULATORY_CARE_PROVIDER_SITE_OTHER): Payer: BC Managed Care – PPO | Admitting: Psychiatry

## 2019-05-15 DIAGNOSIS — F339 Major depressive disorder, recurrent, unspecified: Secondary | ICD-10-CM | POA: Diagnosis not present

## 2019-05-15 MED ORDER — BUPROPION HCL ER (XL) 150 MG PO TB24: 150.0000 mg | ORAL_TABLET | Freq: Every day | ORAL | 1 refills | Status: DC

## 2019-05-15 NOTE — Progress Notes (Signed)
Anna Obrien [id number] 1990/05/06 29 y.o.  Virtual Visit via Telephone Note  I connected with pt on 05/15/19 at 11:30 AM EST by telephone and verified that I am speaking with the correct person using two identifiers.   I discussed the limitations, risks, security and privacy concerns of performing an evaluation and management service by telephone and the availability of in person appointments. I also discussed with the patient that there may be a patient responsible charge related to this service. The patient expressed understanding and agreed to proceed.   I discussed the assessment and treatment plan with the patient. The patient was provided an opportunity to ask questions and all were answered. The patient agreed with the plan and demonstrated an understanding of the instructions.   The patient was advised to call back or seek an in-person evaluation if the symptoms worsen or if the condition fails to improve as anticipated.  I provided 30 minutes of non-face-to-face time during this encounter.  The patient was located at home.  The provider was located at [facility].   Anna Obrien, PMHNP   Subjective:   [name] is a 29 y.o. (DOB 12/01/1990) female.  Chief Complaint:  Chief Complaint  Patient presents with  . Depression  . Anxiety    HPI Anna Obrien presents for follow-up of depression and anxiety. She reports that her mother and her sister have been visiting and her schedule has been disrupted and not getting as much sleep. Has been staying up later. Going to bed 11 pm-1 am. Awakening around 7-7:30 am. Occ vivid dreams. Reports that her energy remains low and that it is likely related to decreased sleep and not having as much time to re-charge. Decreased appetite and will feel mildly nauseous at times when she thinks she is hungry, typically later in the day. Reports that queasiness seems to be sporadic. Denies any recent binge eating or stress  eating. She reports that she has been spending most of her spare time with her sister instead of other tasks. Notices some increased interest and desire to do some tasks. Notices some possible improvement in mood. Has felt "more positive." Notices some sad mood at times, particularly with birthday of deceased father. Does not recall any recent episodes of anxiety. Denies recent irritability. Reports that her concentration has fluctuated and seems to correlate with amount of sleep. More interested in starting some hobbies. Denies SI.   Has continued to work half of the time remotely and half of the time remotely.   Past Psychiatric Medication Trials: Celexa- starting in early 2019 on 20 mg qd. Improved sleep and PMDD s/s. Unsure how helpful it has been for depression and anxiety in general. Trazodone- Excessive somnolence Melatonin- Nightmares OTC sleep aids- ineffective  Review of Systems:  Review of Systems  Gastrointestinal: Positive for abdominal pain and nausea.  Musculoskeletal: Negative for gait problem.  Neurological: Negative for tremors.  Psychiatric/Behavioral:       Please refer to HPI    Medications: I have reviewed the patient's current medications.  Current Outpatient Medications  Medication Sig Dispense Refill  . buPROPion (WELLBUTRIN XL) 150 MG 24 hr tablet Take 1 tablet (150 mg total) by mouth daily. 30 tablet 1  . cholecalciferol (VITAMIN D3) 25 MCG (1000 UNIT) tablet Take 1,000 Units by mouth daily.    . citalopram (CELEXA) 40 MG tablet Take 1 tablet (40 mg total) by mouth daily. 90 tablet 1  . medroxyPROGESTERone (DEPO-PROVERA) 150 MG/ML injection  Inject 150 mg into the muscle. q 10 weeks    . spironolactone (ALDACTONE) 50 MG tablet Take 50 mg by mouth daily.     No current facility-administered medications for this visit.    Medication Side Effects: Nausea and Other: Tremor on 2 occasions  Allergies: No Known Allergies  Past Medical History:  Diagnosis Date   . Endometriosis   . Vitamin D deficiency     Family History  Problem Relation Age of Onset  . Depression Mother   . Diabetes Father   . Heart attack Father   . Anxiety disorder Sister   . Depression Sister     Social History   Socioeconomic History  . Marital status: Single    Spouse name: Not on file  . Number of children: Not on file  . Years of education: Not on file  . Highest education level: Not on file  Occupational History  . Not on file  Tobacco Use  . Smoking status: Never Smoker  . Smokeless tobacco: Never Used  Substance and Sexual Activity  . Alcohol use: Yes    Comment: occ  . Drug use: No  . Sexual activity: Not on file  Other Topics Concern  . Not on file  Social History Narrative  . Not on file   Social Determinants of Health   Financial Resource Strain:   . Difficulty of Paying Living Expenses: Not on file  Food Insecurity:   . Worried About Programme researcher, broadcasting/film/video in the Last Year: Not on file  . Ran Out of Food in the Last Year: Not on file  Transportation Needs:   . Lack of Transportation (Medical): Not on file  . Lack of Transportation (Non-Medical): Not on file  Physical Activity:   . Days of Exercise per Week: Not on file  . Minutes of Exercise per Session: Not on file  Stress:   . Feeling of Stress : Not on file  Social Connections:   . Frequency of Communication with Friends and Family: Not on file  . Frequency of Social Gatherings with Friends and Family: Not on file  . Attends Religious Services: Not on file  . Active Member of Clubs or Organizations: Not on file  . Attends Banker Meetings: Not on file  . Marital Status: Not on file  Intimate Partner Violence:   . Fear of Current or Ex-Partner: Not on file  . Emotionally Abused: Not on file  . Physically Abused: Not on file  . Sexually Abused: Not on file    Past Medical History, Surgical history, Social history, and Family history were reviewed and updated as  appropriate.   Please see review of systems for further details on the patient's review from today.   Objective:   Physical Exam:  Wt 240 lb (108.9 kg)   BMI 38.74 kg/m   Physical Exam Neurological:     Mental Status: She is alert and oriented to person, place, and time.     Cranial Nerves: No dysarthria.  Psychiatric:        Attention and Perception: Attention and perception normal.        Mood and Affect: Mood is depressed.        Speech: Speech normal.        Behavior: Behavior is cooperative.        Thought Content: Thought content normal. Thought content is not paranoid or delusional. Thought content does not include homicidal or suicidal ideation. Thought content does  not include homicidal or suicidal plan.        Cognition and Memory: Cognition and memory normal.        Judgment: Judgment normal.     Comments: Insight intact     Lab Review:  No results found for: NA, K, CL, CO2, GLUCOSE, BUN, CREATININE, CALCIUM, PROT, ALBUMIN, AST, ALT, ALKPHOS, BILITOT, GFRNONAA, GFRAA  No results found for: WBC, RBC, HGB, HCT, PLT, MCV, MCH, MCHC, RDW, LYMPHSABS, MONOABS, EOSABS, BASOSABS  No results found for: POCLITH, LITHIUM   No results found for: PHENYTOIN, PHENOBARB, VALPROATE, CBMZ   .res Assessment: Plan:   Case staffed with Dr. Clovis Pu. Patient reports that benefits of Wellbutrin are currently outweighing possible side effects.  Agreed that more time is needed to evaluate response since patient reports that her usual routine has been disrupted for the last month and she has been sleeping less due to extended visitors.  Discussed allowing more time to determine response to Wellbutrin XL since extended visitors left today and patient anticipates being able to resume regular schedule with increased sleep and self-care. Encouraged patient to monitor if nausea and queasiness tends to have any pattern, such as timing related to meals.  Discussed considering trial of switching to  Wellbutrin SR if nausea worsens or does not improve since different delivery mechanism may potentially improve side effects. Continue Celexa 40 mg daily for depression and anxiety. Recommend continuing psychotherapy. Patient to follow-up with this provider in 4 weeks or sooner if clinically indicated. Patient advised to contact office with any questions, adverse effects, or acute worsening in signs and symptoms.  Tyiesha was seen today for depression and anxiety.  Diagnoses and all orders for this visit:  Recurrent major depressive disorder, remission status unspecified (HCC) -     buPROPion (WELLBUTRIN XL) 150 MG 24 hr tablet; Take 1 tablet (150 mg total) by mouth daily.    Please see After Visit Summary for patient specific instructions.  Future Appointments  Date Time Provider [place]  06/13/2019 11:30 AM Thayer Headings, PMHNP [facility] None    No orders of the defined types were placed in this encounter.     -------------------------------

## 2019-05-19 NOTE — Telephone Encounter (Signed)
Pt. States she is doing better this week. She would only like a 30 day sent in please. Thanks.

## 2019-05-19 NOTE — Telephone Encounter (Signed)
Noted thank you

## 2019-05-19 NOTE — Telephone Encounter (Signed)
Can you check with how patient doing with Wellbutrin and if she wants a 90 day

## 2019-06-13 ENCOUNTER — Ambulatory Visit (INDEPENDENT_AMBULATORY_CARE_PROVIDER_SITE_OTHER): Payer: BC Managed Care – PPO | Admitting: Psychiatry

## 2019-06-13 ENCOUNTER — Encounter: Payer: Self-pay | Admitting: Psychiatry

## 2019-06-13 VITALS — BP 130/85

## 2019-06-13 DIAGNOSIS — F99 Mental disorder, not otherwise specified: Secondary | ICD-10-CM

## 2019-06-13 DIAGNOSIS — F411 Generalized anxiety disorder: Secondary | ICD-10-CM

## 2019-06-13 DIAGNOSIS — F5105 Insomnia due to other mental disorder: Secondary | ICD-10-CM | POA: Diagnosis not present

## 2019-06-13 DIAGNOSIS — F339 Major depressive disorder, recurrent, unspecified: Secondary | ICD-10-CM

## 2019-06-13 MED ORDER — BUPROPION HCL ER (XL) 150 MG PO TB24: 150.0000 mg | ORAL_TABLET | Freq: Every day | ORAL | 0 refills | Status: DC

## 2019-06-13 NOTE — Progress Notes (Signed)
Anna Obrien [id number] 12/04/1990 29 y.o.  Virtual Visit via Telephone Note  I connected with pt on 06/13/19 at 11:30 AM EST by telephone and verified that I am speaking with the correct person using two identifiers.   I discussed the limitations, risks, security and privacy concerns of performing an evaluation and management service by telephone and the availability of in person appointments. I also discussed with the patient that there may be a patient responsible charge related to this service. The patient expressed understanding and agreed to proceed.   I discussed the assessment and treatment plan with the patient. The patient was provided an opportunity to ask questions and all were answered. The patient agreed with the plan and demonstrated an understanding of the instructions.   The patient was advised to call back or seek an in-person evaluation if the symptoms worsen or if the condition fails to improve as anticipated.  I provided 30 minutes of non-face-to-face time during this encounter.  The patient was located at home.  The provider was located at [facility].   Corie Chiquito, PMHNP   Subjective:   [name] is a 29 y.o. (DOB [birth year]) female.  Chief Complaint:  Chief Complaint  Patient presents with  . Follow-up    Anxiety and Depression    HPI [name] presents for follow-up of pression and anxiety.  She reports that depression was previously at a moderate level and now mood chart often indicate that her mood is euthymic. She reports occ anxiety and "I think I have been handling it better." She reports that her work has been stressful and demanding and she has been able to cope without feeling overwhelmed or burned out. Reports that she has been less avoidant. Denies physical s/s with anxiety. She reports things have been "really good."  Reports that nausea has resolved. She reports that her energy and motivation have been improved. Has  cleaned her home, adopted a dog, and signed up for a 5k. She reports that running a 5 k has been a long-term goal and has signed up for a training program. Has wanted a dog for awhile and was afraid of the responsibility in the past. Sleep has been very good. Appetite has been good. Has not binging as much and is eating regular meals. Reports concentration has improved and is able to start and finish tasks. Increased interest and enjoyment in things. Has seen an increase in self-care. Denies irritability. Denies SI.   Has started working with a nutritionist.  Reports that therapist has decreased frequency of visits from weekly to every other week.   "I feel better than I have in I don't know how long."   Past Psychiatric Medication Trials: Celexa- starting in early 2019 on 20 mg qd. Improved sleep and PMDD s/s. Unsure how helpful it has been for depression and anxiety in general. Trazodone- Excessive somnolence Melatonin- Nightmares OTC sleep aids- ineffective  Review of Systems:  Review of Systems  Gastrointestinal: Negative for nausea.  Musculoskeletal: Negative for gait problem.  Neurological: Negative for tremors and headaches.  Psychiatric/Behavioral:       Please refer to HPI    Medications: I have reviewed the patient's current medications.  Current Outpatient Medications  Medication Sig Dispense Refill  . cholecalciferol (VITAMIN D3) 25 MCG (1000 UNIT) tablet Take 1,000 Units by mouth daily.    . citalopram (CELEXA) 40 MG tablet Take 1 tablet (40 mg total) by mouth daily. 90 tablet 1  .  medroxyPROGESTERone (DEPO-PROVERA) 150 MG/ML injection Inject 150 mg into the muscle. q 10 weeks    . spironolactone (ALDACTONE) 50 MG tablet Take 50 mg by mouth daily.    Marland Kitchen buPROPion (WELLBUTRIN XL) 150 MG 24 hr tablet Take 1 tablet (150 mg total) by mouth daily. 90 tablet 0   No current facility-administered medications for this visit.    Medication Side Effects: None  Allergies: No  Known Allergies  Past Medical History:  Diagnosis Date  . Endometriosis   . Vitamin D deficiency     Family History  Problem Relation Age of Onset  . Depression Mother   . Diabetes Father   . Heart attack Father   . Anxiety disorder Sister   . Depression Sister     Social History   Socioeconomic History  . Marital status: Single    Spouse name: Not on file  . Number of children: Not on file  . Years of education: Not on file  . Highest education level: Not on file  Occupational History  . Not on file  Tobacco Use  . Smoking status: Never Smoker  . Smokeless tobacco: Never Used  Substance and Sexual Activity  . Alcohol use: Yes    Comment: occ  . Drug use: No  . Sexual activity: Not on file  Other Topics Concern  . Not on file  Social History Narrative  . Not on file   Social Determinants of Health   Financial Resource Strain:   . Difficulty of Paying Living Expenses: Not on file  Food Insecurity:   . Worried About Programme researcher, broadcasting/film/video in the Last Year: Not on file  . Ran Out of Food in the Last Year: Not on file  Transportation Needs:   . Lack of Transportation (Medical): Not on file  . Lack of Transportation (Non-Medical): Not on file  Physical Activity:   . Days of Exercise per Week: Not on file  . Minutes of Exercise per Session: Not on file  Stress:   . Feeling of Stress : Not on file  Social Connections:   . Frequency of Communication with Friends and Family: Not on file  . Frequency of Social Gatherings with Friends and Family: Not on file  . Attends Religious Services: Not on file  . Active Member of Clubs or Organizations: Not on file  . Attends Banker Meetings: Not on file  . Marital Status: Not on file  Intimate Partner Violence:   . Fear of Current or Ex-Partner: Not on file  . Emotionally Abused: Not on file  . Physically Abused: Not on file  . Sexually Abused: Not on file    Past Medical History, Surgical history, Social  history, and Family history were reviewed and updated as appropriate.   Please see review of systems for further details on the patient's review from today.   Objective:   Physical Exam:  BP 130/85   Physical Exam Neurological:     Mental Status: She is alert and oriented to person, place, and time.     Cranial Nerves: No dysarthria.  Psychiatric:        Attention and Perception: Attention and perception normal.        Mood and Affect: Mood normal.        Speech: Speech normal.        Behavior: Behavior is cooperative.        Thought Content: Thought content normal. Thought content is not paranoid or delusional.  Thought content does not include homicidal or suicidal ideation. Thought content does not include homicidal or suicidal plan.        Cognition and Memory: Cognition and memory normal.        Judgment: Judgment normal.     Comments: Insight intact     Lab Review:  No results found for: NA, K, CL, CO2, GLUCOSE, BUN, CREATININE, CALCIUM, PROT, ALBUMIN, AST, ALT, ALKPHOS, BILITOT, GFRNONAA, GFRAA  No results found for: WBC, RBC, HGB, HCT, PLT, MCV, MCH, MCHC, RDW, LYMPHSABS, MONOABS, EOSABS, BASOSABS  No results found for: POCLITH, LITHIUM   No results found for: PHENYTOIN, PHENOBARB, VALPROATE, CBMZ   .res Assessment: Plan:   Discussed continuing Wellbutrin XL 150 mg daily since patient reports a significant improvement in depressive signs and symptoms since starting Wellbutrin XL.  We will continue to monitor signs and symptoms to determine if further increase is indicated or if signs and symptoms are well controlled at this dose. Continue citalopram 40 mg daily for depression and anxiety. Patient to follow-up in 2 months or sooner if clinically indicated. Patient advised to contact office with any questions, adverse effects, or acute worsening in signs and symptoms.  Lavaeh was seen today for follow-up.  Diagnoses and all orders for this visit:  Recurrent major  depressive disorder, remission status unspecified (HCC) -     buPROPion (WELLBUTRIN XL) 150 MG 24 hr tablet; Take 1 tablet (150 mg total) by mouth daily.  Generalized anxiety disorder  Insomnia due to other mental disorder    Please see After Visit Summary for patient specific instructions.  Future Appointments  Date Time Provider [place]  08/14/2019  8:30 AM Thayer Headings, PMHNP [facility] None    No orders of the defined types were placed in this encounter.     -------------------------------

## 2019-08-14 ENCOUNTER — Encounter: Payer: Self-pay | Admitting: Psychiatry

## 2019-08-14 ENCOUNTER — Other Ambulatory Visit: Payer: Self-pay

## 2019-08-14 ENCOUNTER — Ambulatory Visit (INDEPENDENT_AMBULATORY_CARE_PROVIDER_SITE_OTHER): Payer: BC Managed Care – PPO | Admitting: Psychiatry

## 2019-08-14 VITALS — BP 131/83 | HR 80

## 2019-08-14 DIAGNOSIS — F339 Major depressive disorder, recurrent, unspecified: Secondary | ICD-10-CM

## 2019-08-14 DIAGNOSIS — F900 Attention-deficit hyperactivity disorder, predominantly inattentive type: Secondary | ICD-10-CM | POA: Diagnosis not present

## 2019-08-14 DIAGNOSIS — F411 Generalized anxiety disorder: Secondary | ICD-10-CM | POA: Diagnosis not present

## 2019-08-14 MED ORDER — BUPROPION HCL ER (XL) 300 MG PO TB24: 300.0000 mg | ORAL_TABLET | Freq: Every day | ORAL | 1 refills | Status: DC

## 2019-08-14 NOTE — Progress Notes (Signed)
Anna Obrien [id number] 29-Jan-1991 29 y.o.  Subjective:   [name] is a 29 y.o. (DOB [birth year]) female.  Chief Complaint:  Chief Complaint  Patient presents with  . Depression    HPI Anna Obrien presents to the office today for follow-up of depression and anxiety. She reports, "overall better than it's been. I am doing more things, but easily burnt out." She reports that over the weekend she was very active and had several social activities. She reports that she then felt very tired, depression and less motivated over the last few days. Had urge to cry and felt overwhelmed. Reports that her motivation has been lower over the last few days. Has noticed more negative self-talk. She reports that she has been "self-sabotaging" her efforts to become healthier. She reports that there have been some fluctuations in her mood, particularly over the last 2 weeks. She reports that she is noticing increased irritable at work. She reports being irritated by some things that she normally would let go and recently had some road rage. Denies elevated moods. Occ binge eating and some spending when she is trying to save money. Reports that she has been spending an excessive amount on toys for her dog. Has been having a strong urge to buy things on [place] and has been resisting this urge. Reports anxiety about thinking that she is not doing enough. Reports that she is going to be consistently around 10 pm and sleeps until about 6:45 am. She reports that she falls asleep quicker and tends to stay asleep better but is not feeling rested upon awakening. Energy and motivation levels fluctuates and is sometimes ok and able to be productive and other times does not feel like doing anything. Denies excessive energy. Appetite has been "ok, and almost lower." She reports that she has been craving certain foods and does not feel satisfied until she has that food, and then binges on them. Concentration has been  diminished. Reports concentration is better when she is working on site. Reports that she has difficulty with concentration with working from home and has tried different things to help with concentration and distractibility. Denies SI.   She reports that she has always had difficulty with concentration and focus. Has had to take notes in class. Reports that she was never able to read a text book. She reports that she frequently daydreams. She reports that she cannot have her environment too loud or too quiet. She reports difficulty getting started on things, particularly things she is less interested in. Reports difficulty making decisions. She reports that she is frequently losing and misplacing things and recently has lost her wallet and keys and locked herself out of her home. She reports that she is good at keeping organized "but not staying organized." She reports that she had difficulty in social situations because she wants to interrupt and then is not sure when it is ok to interject, and then she tends to stay quiet. Occ makes careless mistakes or overlooks things. Will sometimes make unnecessary extra steps without realizing it. She reports that she gets pulled off task easily.  Denies any PMDD s/s since starting Depo Provera and Celexa.  Past Psychiatric Medication Trials: Celexa- starting in early 2019 on 20 mg qd. Improved sleep and PMDD s/s. Unsure how helpful it has been for depression and anxiety in general. Trazodone- Excessive somnolence Melatonin- Nightmares OTC sleep aids- ineffective    Review of Systems:  Review of Systems  Musculoskeletal: Negative  for gait problem.  Neurological:       Rare hand tremor  Psychiatric/Behavioral:       Please refer to HPI  Reports that she had physical exam in February and labs were WNL.   Medications: I have reviewed the patient's current medications.  Current Outpatient Medications  Medication Sig Dispense Refill  . cholecalciferol  (VITAMIN D3) 25 MCG (1000 UNIT) tablet Take 2,000 Units by mouth daily.     . citalopram (CELEXA) 40 MG tablet Take 1 tablet (40 mg total) by mouth daily. 90 tablet 1  . medroxyPROGESTERone (DEPO-PROVERA) 150 MG/ML injection Inject 150 mg into the muscle. q 10 weeks    . Multiple Vitamins-Minerals (WHOLE FOOD MULTIVITAMIN PO) Take by mouth.    . spironolactone (ALDACTONE) 50 MG tablet Take 50 mg by mouth daily.    Marland Kitchen buPROPion (WELLBUTRIN XL) 300 MG 24 hr tablet Take 1 tablet (300 mg total) by mouth daily. 30 tablet 1   No current facility-administered medications for this visit.    Medication Side Effects: Other: Rare hand tremor  Allergies: No Known Allergies  Past Medical History:  Diagnosis Date  . Endometriosis   . Vitamin D deficiency     Family History  Problem Relation Age of Onset  . Depression Mother   . Diabetes Father   . Heart attack Father   . Anxiety disorder Sister   . Depression Sister     Social History   Socioeconomic History  . Marital status: Single    Spouse name: Not on file  . Number of children: Not on file  . Years of education: Not on file  . Highest education level: Not on file  Occupational History  . Not on file  Tobacco Use  . Smoking status: Never Smoker  . Smokeless tobacco: Never Used  Substance and Sexual Activity  . Alcohol use: Yes    Comment: occ  . Drug use: No  . Sexual activity: Not on file  Other Topics Concern  . Not on file  Social History Narrative  . Not on file   Social Determinants of Health   Financial Resource Strain:   . Difficulty of Paying Living Expenses:   Food Insecurity:   . Worried About Programme researcher, broadcasting/film/video in the Last Year:   . Barista in the Last Year:   Transportation Needs:   . Freight forwarder (Medical):   Marland Kitchen Lack of Transportation (Non-Medical):   Physical Activity:   . Days of Exercise per Week:   . Minutes of Exercise per Session:   Stress:   . Feeling of Stress :   Social  Connections:   . Frequency of Communication with Friends and Family:   . Frequency of Social Gatherings with Friends and Family:   . Attends Religious Services:   . Active Member of Clubs or Organizations:   . Attends Banker Meetings:   Marland Kitchen Marital Status:   Intimate Partner Violence:   . Fear of Current or Ex-Partner:   . Emotionally Abused:   Marland Kitchen Physically Abused:   . Sexually Abused:     Past Medical History, Surgical history, Social history, and Family history were reviewed and updated as appropriate.   Please see review of systems for further details on the patient's review from today.   Objective:   Physical Exam:  BP 131/83   Pulse 80   Physical Exam Constitutional:      General: She is not in  acute distress. Musculoskeletal:        General: No deformity.  Neurological:     Mental Status: She is alert and oriented to person, place, and time.     Coordination: Coordination normal.  Psychiatric:        Attention and Perception: Attention and perception normal. She does not perceive auditory or visual hallucinations.        Mood and Affect: Mood is depressed. Mood is not anxious. Affect is not labile, blunt, angry or inappropriate.        Speech: Speech normal.        Behavior: Behavior normal.        Thought Content: Thought content normal. Thought content is not paranoid or delusional. Thought content does not include homicidal or suicidal ideation. Thought content does not include homicidal or suicidal plan.        Cognition and Memory: Cognition and memory normal.        Judgment: Judgment normal.     Comments: Insight intact     Lab Review:  No results found for: NA, K, CL, CO2, GLUCOSE, BUN, CREATININE, CALCIUM, PROT, ALBUMIN, AST, ALT, ALKPHOS, BILITOT, GFRNONAA, GFRAA  No results found for: WBC, RBC, HGB, HCT, PLT, MCV, MCH, MCHC, RDW, LYMPHSABS, MONOABS, EOSABS, BASOSABS  No results found for: POCLITH, LITHIUM   No results found for:  PHENYTOIN, PHENOBARB, VALPROATE, CBMZ   .res Assessment: Plan:    Discussed that reported signs and symptoms are consistent with attention deficit disorder, inattentive type, and discussed possible treatment options to include Vyvanse which is also indicated for binge eating disorder and may help with episodes of emotional eating.  Discussed other treatment options would be to increase Wellbutrin XL to 300 mg daily to improve depressive signs and symptoms and that Wellbutrin XL is also used off label for treatment of ADHD, and higher dose may also be helpful for ADD signs and symptoms.  Patient reports that she would first like to try increased dose of Wellbutrin XL and then consider treatment for ADD if needed. Increase Wellbutrin XL to 300 mg daily to improve depressive signs and symptoms. Continue citalopram 40 mg daily for depression and anxiety. Recommend continuing psychotherapy. Patient to follow-up in 4 weeks or sooner if clinically indicated. Patient advised to contact office with any questions, adverse effects, or acute worsening in signs and symptoms.    Hollye was seen today for depression.  Diagnoses and all orders for this visit:  Recurrent major depressive disorder, remission status unspecified (HCC) -     buPROPion (WELLBUTRIN XL) 300 MG 24 hr tablet; Take 1 tablet (300 mg total) by mouth daily.  Generalized anxiety disorder  Attention deficit hyperactivity disorder (ADHD), predominantly inattentive type     Please see After Visit Summary for patient specific instructions.  Future Appointments  Date Time Provider Department Center  09/11/2019  5:00 PM Corie Chiquito, PMHNP [facility] None    No orders of the defined types were placed in this encounter.   -------------------------------

## 2019-09-01 ENCOUNTER — Other Ambulatory Visit: Payer: Self-pay

## 2019-09-01 ENCOUNTER — Encounter (HOSPITAL_COMMUNITY): Payer: Self-pay

## 2019-09-01 ENCOUNTER — Ambulatory Visit (HOSPITAL_COMMUNITY)
Admission: EM | Admit: 2019-09-01 | Discharge: 2019-09-01 | Disposition: A | Discharge status: Home / Self Care | Payer: No Typology Code available for payment source | Attending: Emergency Medicine | Admitting: Emergency Medicine

## 2019-09-01 ENCOUNTER — Emergency Department (HOSPITAL_COMMUNITY)
Admission: EM | Admit: 2019-09-01 | Discharge: 2019-09-01 | Disposition: A | Discharge status: Home / Self Care | Payer: BC Managed Care – PPO | Attending: Emergency Medicine | Admitting: Emergency Medicine

## 2019-09-01 DIAGNOSIS — T7421XA Adult sexual abuse, confirmed, initial encounter: Secondary | ICD-10-CM | POA: Diagnosis not present

## 2019-09-01 DIAGNOSIS — Z79899 Other long term (current) drug therapy: Secondary | ICD-10-CM | POA: Insufficient documentation

## 2019-09-01 DIAGNOSIS — Z0441 Encounter for examination and observation following alleged adult rape: Secondary | ICD-10-CM | POA: Insufficient documentation

## 2019-09-01 MED ORDER — METRONIDAZOLE 500 MG PO TABS
2000.0000 mg | ORAL_TABLET | Freq: Once | ORAL | Status: AC
  Administered 2019-09-01: 23:00:00 2000 mg via ORAL

## 2019-09-01 MED ORDER — AZITHROMYCIN 250 MG PO TABS
1000.0000 mg | ORAL_TABLET | Freq: Once | ORAL | Status: AC
  Administered 2019-09-01: 1000 mg via ORAL

## 2019-09-01 MED ORDER — LIDOCAINE HCL (PF) 1 % IJ SOLN
0.9000 mL | Freq: Once | INTRAMUSCULAR | Status: AC
  Administered 2019-09-01: 0.9 mL

## 2019-09-01 MED ORDER — CEFTRIAXONE SODIUM 500 MG IJ SOLR
250.0000 mg | Freq: Once | INTRAMUSCULAR | Status: AC
  Administered 2019-09-01: 250 mg via INTRAMUSCULAR

## 2019-09-01 NOTE — ED Notes (Signed)
SANE RN at [facility]
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
## 2019-09-02 LAB — POCT PREGNANCY, URINE: Preg Test, Ur: NEGATIVE
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
## 2019-09-06 ENCOUNTER — Other Ambulatory Visit: Payer: Self-pay | Admitting: Psychiatry

## 2019-09-06 DIAGNOSIS — F339 Major depressive disorder, recurrent, unspecified: Secondary | ICD-10-CM

## 2019-09-11 ENCOUNTER — Ambulatory Visit (INDEPENDENT_AMBULATORY_CARE_PROVIDER_SITE_OTHER): Payer: BC Managed Care – PPO | Admitting: Psychiatry

## 2019-09-11 ENCOUNTER — Other Ambulatory Visit: Payer: Self-pay

## 2019-09-11 ENCOUNTER — Encounter: Payer: Self-pay | Admitting: Psychiatry

## 2019-09-11 VITALS — BP 120/79 | HR 79

## 2019-09-11 DIAGNOSIS — F430 Acute stress reaction: Secondary | ICD-10-CM | POA: Diagnosis not present

## 2019-09-11 DIAGNOSIS — F411 Generalized anxiety disorder: Secondary | ICD-10-CM | POA: Diagnosis not present

## 2019-09-11 DIAGNOSIS — F339 Major depressive disorder, recurrent, unspecified: Secondary | ICD-10-CM

## 2019-09-11 MED ORDER — PROPRANOLOL HCL 10 MG PO TABS: 10.0000 mg | ORAL_TABLET | Freq: Three times a day (TID) | ORAL | 1 refills | Status: DC

## 2019-09-11 MED ORDER — CITALOPRAM HYDROBROMIDE 40 MG PO TABS: 40.0000 mg | ORAL_TABLET | Freq: Every day | ORAL | 1 refills | Status: DC
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
## 2019-09-24 ENCOUNTER — Ambulatory Visit: Payer: BC Managed Care – PPO | Admitting: Psychology

## 2019-10-05 ENCOUNTER — Other Ambulatory Visit: Payer: Self-pay | Admitting: Psychiatry

## 2019-10-05 DIAGNOSIS — F430 Acute stress reaction: Secondary | ICD-10-CM

## 2019-10-05 DIAGNOSIS — F411 Generalized anxiety disorder: Secondary | ICD-10-CM

## 2019-10-05 DIAGNOSIS — F339 Major depressive disorder, recurrent, unspecified: Secondary | ICD-10-CM

## 2019-10-09 ENCOUNTER — Other Ambulatory Visit: Payer: Self-pay

## 2019-10-09 ENCOUNTER — Encounter: Payer: Self-pay | Admitting: Psychiatry

## 2019-10-09 ENCOUNTER — Ambulatory Visit (INDEPENDENT_AMBULATORY_CARE_PROVIDER_SITE_OTHER): Payer: BC Managed Care – PPO | Admitting: Psychiatry

## 2019-10-09 VITALS — BP 131/81 | HR 78

## 2019-10-09 DIAGNOSIS — F99 Mental disorder, not otherwise specified: Secondary | ICD-10-CM

## 2019-10-09 DIAGNOSIS — F5105 Insomnia due to other mental disorder: Secondary | ICD-10-CM

## 2019-10-09 DIAGNOSIS — F339 Major depressive disorder, recurrent, unspecified: Secondary | ICD-10-CM | POA: Diagnosis not present

## 2019-10-09 DIAGNOSIS — F430 Acute stress reaction: Secondary | ICD-10-CM | POA: Diagnosis not present

## 2019-10-09 DIAGNOSIS — F411 Generalized anxiety disorder: Secondary | ICD-10-CM | POA: Diagnosis not present

## 2019-10-09 MED ORDER — DAYVIGO 5 MG PO TABS: 5.0000 mg | ORAL_TABLET | Freq: Every day | ORAL | 0 refills | Status: DC

## 2019-10-09 MED ORDER — BUSPIRONE HCL 15 MG PO TABS: ORAL_TABLET | ORAL | 1 refills | Status: DC
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
## 2019-11-01 ENCOUNTER — Other Ambulatory Visit: Payer: Self-pay | Admitting: Psychiatry

## 2019-11-01 DIAGNOSIS — F411 Generalized anxiety disorder: Secondary | ICD-10-CM

## 2019-11-01 DIAGNOSIS — F430 Acute stress reaction: Secondary | ICD-10-CM

## 2019-11-14 ENCOUNTER — Encounter: Payer: Self-pay | Admitting: Psychiatry

## 2019-11-14 ENCOUNTER — Ambulatory Visit (INDEPENDENT_AMBULATORY_CARE_PROVIDER_SITE_OTHER): Payer: BC Managed Care – PPO | Admitting: Psychiatry

## 2019-11-14 ENCOUNTER — Other Ambulatory Visit: Payer: Self-pay

## 2019-11-14 VITALS — BP 123/73 | HR 77

## 2019-11-14 DIAGNOSIS — F99 Mental disorder, not otherwise specified: Secondary | ICD-10-CM

## 2019-11-14 DIAGNOSIS — F430 Acute stress reaction: Secondary | ICD-10-CM

## 2019-11-14 DIAGNOSIS — F5105 Insomnia due to other mental disorder: Secondary | ICD-10-CM | POA: Diagnosis not present

## 2019-11-14 DIAGNOSIS — F339 Major depressive disorder, recurrent, unspecified: Secondary | ICD-10-CM | POA: Diagnosis not present

## 2019-11-14 DIAGNOSIS — F411 Generalized anxiety disorder: Secondary | ICD-10-CM

## 2019-11-14 MED ORDER — BUSPIRONE HCL 15 MG PO TABS: ORAL_TABLET | ORAL | 0 refills | Status: DC

## 2019-11-14 MED ORDER — BUPROPION HCL ER (XL) 300 MG PO TB24: 300.0000 mg | ORAL_TABLET | Freq: Every day | ORAL | 0 refills | Status: DC

## 2019-11-14 MED ORDER — PROPRANOLOL HCL 10 MG PO TABS: 10.0000 mg | ORAL_TABLET | Freq: Three times a day (TID) | ORAL | 1 refills | Status: DC
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
## 2019-11-16 MED ORDER — BELSOMRA 15 MG PO TABS: 15.0000 mg | ORAL_TABLET | Freq: Every day | ORAL | 0 refills | Status: AC

## 2019-11-16 MED ORDER — BELSOMRA 20 MG PO TABS: 20.0000 mg | ORAL_TABLET | Freq: Every day | ORAL | 0 refills | Status: AC

## 2019-11-16 NOTE — Addendum Note (Signed)
Addended by: Derenda Mis on: 11/16/2019 03:53 PM   Modules accepted: Orders

## 2019-11-25 ENCOUNTER — Other Ambulatory Visit: Payer: Self-pay | Admitting: Psychiatry

## 2019-11-25 DIAGNOSIS — F430 Acute stress reaction: Secondary | ICD-10-CM

## 2019-11-25 DIAGNOSIS — F411 Generalized anxiety disorder: Secondary | ICD-10-CM

## 2019-12-21 ENCOUNTER — Other Ambulatory Visit: Payer: Self-pay | Admitting: Psychiatry

## 2019-12-21 DIAGNOSIS — F411 Generalized anxiety disorder: Secondary | ICD-10-CM

## 2019-12-21 DIAGNOSIS — F430 Acute stress reaction: Secondary | ICD-10-CM

## 2019-12-26 ENCOUNTER — Ambulatory Visit: Payer: BC Managed Care – PPO | Admitting: Psychiatry

## 2020-01-02 ENCOUNTER — Encounter: Payer: Self-pay | Admitting: Psychiatry

## 2020-01-02 ENCOUNTER — Other Ambulatory Visit: Payer: Self-pay

## 2020-01-02 ENCOUNTER — Ambulatory Visit (INDEPENDENT_AMBULATORY_CARE_PROVIDER_SITE_OTHER): Payer: BC Managed Care – PPO | Admitting: Psychiatry

## 2020-01-02 DIAGNOSIS — F411 Generalized anxiety disorder: Secondary | ICD-10-CM | POA: Diagnosis not present

## 2020-01-02 DIAGNOSIS — F339 Major depressive disorder, recurrent, unspecified: Secondary | ICD-10-CM

## 2020-01-02 DIAGNOSIS — F430 Acute stress reaction: Secondary | ICD-10-CM

## 2020-01-02 MED ORDER — BUSPIRONE HCL 15 MG PO TABS: ORAL_TABLET | ORAL | 0 refills | Status: DC

## 2020-01-02 MED ORDER — BUPROPION HCL ER (XL) 300 MG PO TB24: 300.0000 mg | ORAL_TABLET | Freq: Every day | ORAL | 0 refills | Status: DC

## 2020-01-02 MED ORDER — CITALOPRAM HYDROBROMIDE 40 MG PO TABS: 40.0000 mg | ORAL_TABLET | Freq: Every day | ORAL | 1 refills | Status: DC
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
## 2020-03-03 ENCOUNTER — Other Ambulatory Visit: Payer: Self-pay

## 2020-03-03 ENCOUNTER — Ambulatory Visit (INDEPENDENT_AMBULATORY_CARE_PROVIDER_SITE_OTHER): Payer: BC Managed Care – PPO | Admitting: Psychiatry

## 2020-03-03 ENCOUNTER — Encounter: Payer: Self-pay | Admitting: Psychiatry

## 2020-03-03 VITALS — BP 121/77 | HR 86 | Wt 250.0 lb

## 2020-03-03 DIAGNOSIS — F339 Major depressive disorder, recurrent, unspecified: Secondary | ICD-10-CM | POA: Diagnosis not present

## 2020-03-03 DIAGNOSIS — F430 Acute stress reaction: Secondary | ICD-10-CM | POA: Diagnosis not present

## 2020-03-03 DIAGNOSIS — F900 Attention-deficit hyperactivity disorder, predominantly inattentive type: Secondary | ICD-10-CM | POA: Diagnosis not present

## 2020-03-03 DIAGNOSIS — F411 Generalized anxiety disorder: Secondary | ICD-10-CM

## 2020-03-03 MED ORDER — BUPROPION HCL ER (XL) 300 MG PO TB24: 300.0000 mg | ORAL_TABLET | Freq: Every day | ORAL | 0 refills | Status: DC

## 2020-03-03 MED ORDER — LISDEXAMFETAMINE DIMESYLATE 30 MG PO CAPS: 30.0000 mg | ORAL_CAPSULE | Freq: Every day | ORAL | 0 refills | Status: DC

## 2020-03-03 MED ORDER — BUSPIRONE HCL 15 MG PO TABS: ORAL_TABLET | ORAL | 0 refills | Status: DC
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
## 2020-03-09 ENCOUNTER — Telehealth: Payer: Self-pay | Admitting: Psychiatry

## 2020-03-09 ENCOUNTER — Other Ambulatory Visit: Payer: Self-pay

## 2020-03-09 NOTE — Telephone Encounter (Signed)
Prior authorization submitted and approved for VYVANSE 30 MG effective 03/09/2020-03/09/2023 with [facility]

## 2020-03-09 NOTE — Telephone Encounter (Signed)
Anna Obrien called to see if the pharmacy had sent a request for a PA on her vyvanse.  I don't see one in her chart but I can't always see that.  I do see a request in covermymeds.  She said it has been about a week and just checking on the status.

## 2020-03-09 NOTE — Telephone Encounter (Signed)
Prior approval for Vyvanse 30 mg effective 03/09/20

## 2020-04-08 ENCOUNTER — Ambulatory Visit: Payer: BC Managed Care – PPO | Admitting: Psychiatry

## 2020-05-28 ENCOUNTER — Encounter: Payer: Self-pay | Admitting: Psychiatry

## 2020-05-28 ENCOUNTER — Telehealth: Payer: Self-pay | Admitting: Psychiatry

## 2020-05-28 ENCOUNTER — Telehealth (INDEPENDENT_AMBULATORY_CARE_PROVIDER_SITE_OTHER): Payer: 59 | Admitting: Psychiatry

## 2020-05-28 DIAGNOSIS — F430 Acute stress reaction: Secondary | ICD-10-CM | POA: Diagnosis not present

## 2020-05-28 DIAGNOSIS — F900 Attention-deficit hyperactivity disorder, predominantly inattentive type: Secondary | ICD-10-CM | POA: Diagnosis not present

## 2020-05-28 DIAGNOSIS — F339 Major depressive disorder, recurrent, unspecified: Secondary | ICD-10-CM | POA: Diagnosis not present

## 2020-05-28 DIAGNOSIS — F411 Generalized anxiety disorder: Secondary | ICD-10-CM | POA: Diagnosis not present

## 2020-05-28 MED ORDER — LISDEXAMFETAMINE DIMESYLATE 30 MG PO CAPS: 30.0000 mg | ORAL_CAPSULE | Freq: Every day | ORAL | 0 refills | Status: DC

## 2020-05-28 MED ORDER — BUSPIRONE HCL 15 MG PO TABS
ORAL_TABLET | ORAL | 0 refills | Status: DC
Start: 2020-05-28 — End: 2020-12-07

## 2020-05-28 MED ORDER — BUPROPION HCL ER (XL) 300 MG PO TB24
300.0000 mg | ORAL_TABLET | Freq: Every day | ORAL | 0 refills | Status: DC
Start: 2020-05-28 — End: 2020-12-07

## 2020-05-28 MED ORDER — PROPRANOLOL HCL 10 MG PO TABS: 10.0000 mg | ORAL_TABLET | Freq: Three times a day (TID) | ORAL | 0 refills | Status: DC

## 2020-05-28 MED ORDER — CITALOPRAM HYDROBROMIDE 40 MG PO TABS: 40.0000 mg | ORAL_TABLET | Freq: Every day | ORAL | 1 refills | Status: DC

## 2020-05-28 NOTE — Progress Notes (Signed)
Anna Obrien [id number] [birth year] 30 y.o.  Virtual Visit via Video Note  I connected with pt @ on 05/28/20 at  8:30 AM EST by a video enabled telemedicine application and verified that I am speaking with the correct person using two identifiers.   I discussed the limitations of evaluation and management by telemedicine and the availability of in person appointments. The patient expressed understanding and agreed to proceed.  I discussed the assessment and treatment plan with the patient. The patient was provided an opportunity to ask questions and all were answered. The patient agreed with the plan and demonstrated an understanding of the instructions.   The patient was advised to call back or seek an in-person evaluation if the symptoms worsen or if the condition fails to improve as anticipated.  I provided 30 minutes of non-face-to-face time during this encounter.  The patient was located at home.  The provider was located at [facility].   Corie Chiquito, PMHNP   Subjective:   [name] is a 30 y.o. (DOB [birth year]) female.  Chief Complaint:  Chief Complaint  Patient presents with  . Follow-up    ADD, anxiety, depression    HPI Anna Obrien presents for follow-up of anxiety, depression, and ADD. She reports that she noticed "subtle differences" after starting Vyvanse and was more productive at work and was not putting off things as much. She reports that she stopped taking it over the holidays and when she restarted it she noticed some improvement in concentration. She reports that she notices she is better able to start and compete tasks. She reports that she is now able to break down tasks into smaller batches. She reports that she has been able to read more and enjoy it. Has been able to go to the gym more often before the inclement weather. She reports that energy and motivation have been improved. Has been sleeping better. Denies any worsening anxiety.  She reports that she continues to work with her therapist. She reports that she has been able to take out her dog at night again. She reports that she was able to talk with a female at a friend's wedding. Denies any panic s/s. Goes to sleep around 10 pm and awakens around 5-6:30. Appetite has been about the same or slightly decreased. Denies SI.   She reports that her energy is lower around 6 pm and after.   She reports that she notices she feels more comfortable in her bedroom now and it feels more safe again.   Takes Propranolol every morning and takes it again prn only.   Past Psychiatric Medication Trials: Celexa- starting in early 2019 on 20 mg qd. Improved sleep and PMDD s/s. Unsure how helpful it has been for depression and anxiety in general. Wellbutrin XL Trazodone- Excessive somnolence Melatonin- Nightmares OTC sleep aids- ineffective Propranolol- helpful Buspar- helpful Dayvigo- helpful   Review of Systems:  Review of Systems  Cardiovascular: Negative for palpitations.  Musculoskeletal: Negative for gait problem.  Neurological: Negative for tremors.  Psychiatric/Behavioral:       Please refer to HPI   Continues to work with nutritionist. She reports that her resting metabolism was low.   Medications: I have reviewed the patient's current medications.  Current Outpatient Medications  Medication Sig Dispense Refill  . cholecalciferol (VITAMIN D3) 25 MCG (1000 UNIT) tablet Take 2,000 Units by mouth daily.     Melene Muller ON 06/25/2020] lisdexamfetamine (VYVANSE) 30 MG capsule Take 1 capsule (30 mg  total) by mouth daily. 30 capsule 0  . [START ON 07/23/2020] lisdexamfetamine (VYVANSE) 30 MG capsule Take 1 capsule (30 mg total) by mouth daily. 30 capsule 0  . medroxyPROGESTERone (DEPO-PROVERA) 150 MG/ML injection Inject 150 mg into the muscle. q 10 weeks    . Multiple Vitamins-Minerals (WHOLE FOOD MULTIVITAMIN PO) Take by mouth.    . spironolactone (ALDACTONE) 50 MG tablet Take  50 mg by mouth daily.    Marland Kitchen buPROPion (WELLBUTRIN XL) 300 MG 24 hr tablet Take 1 tablet (300 mg total) by mouth daily. 90 tablet 0  . busPIRone (BUSPAR) 15 MG tablet TAKE 1 TAB BY MOUTH TWICE DAILY 180 tablet 0  . citalopram (CELEXA) 40 MG tablet Take 1 tablet (40 mg total) by mouth daily. 90 tablet 1  . lisdexamfetamine (VYVANSE) 30 MG capsule Take 1 capsule (30 mg total) by mouth daily. 30 capsule 0  . propranolol (INDERAL) 10 MG tablet Take 1 tablet (10 mg total) by mouth 3 (three) times daily. 270 tablet 0   No current facility-administered medications for this visit.    Medication Side Effects: None  Allergies: No Known Allergies  Past Medical History:  Diagnosis Date  . Endometriosis   . Vitamin D deficiency     Family History  Problem Relation Age of Onset  . Depression Mother   . Diabetes Father   . Heart attack Father   . Anxiety disorder Sister   . Depression Sister     Social History   Socioeconomic History  . Marital status: Single    Spouse name: Not on file  . Number of children: Not on file  . Years of education: Not on file  . Highest education level: Not on file  Occupational History  . Not on file  Tobacco Use  . Smoking status: Never Smoker  . Smokeless tobacco: Never Used  Substance and Sexual Activity  . Alcohol use: Yes    Comment: occ  . Drug use: No  . Sexual activity: Not on file  Other Topics Concern  . Not on file  Social History Narrative  . Not on file   Social Determinants of Health   Financial Resource Strain: Not on file  Food Insecurity: Not on file  Transportation Needs: Not on file  Physical Activity: Not on file  Stress: Not on file  Social Connections: Not on file  Intimate Partner Violence: Not on file    Past Medical History, Surgical history, Social history, and Family history were reviewed and updated as appropriate.   Please see review of systems for further details on the patient's review from today.    Objective:   Physical Exam:  Pulse 70   Physical Exam Neurological:     Mental Status: She is alert and oriented to person, place, and time.     Cranial Nerves: No dysarthria.  Psychiatric:        Attention and Perception: Attention and perception normal.        Mood and Affect: Mood normal.        Speech: Speech normal.        Behavior: Behavior is cooperative.        Thought Content: Thought content normal. Thought content is not paranoid or delusional. Thought content does not include homicidal or suicidal ideation. Thought content does not include homicidal or suicidal plan.        Cognition and Memory: Cognition and memory normal.        Judgment: Judgment normal.  Comments: Insight intact     Lab Review:  No results found for: NA, K, CL, CO2, GLUCOSE, BUN, CREATININE, CALCIUM, PROT, ALBUMIN, AST, ALT, ALKPHOS, BILITOT, GFRNONAA, GFRAA  No results found for: WBC, RBC, HGB, HCT, PLT, MCV, MCH, MCHC, RDW, LYMPHSABS, MONOABS, EOSABS, BASOSABS  No results found for: POCLITH, LITHIUM   No results found for: PHENYTOIN, PHENOBARB, VALPROATE, CBMZ   .res Assessment: Plan:   Has annual labs in February. Will continue current plan of care since target signs and symptoms are well controlled without any tolerability issues. Recommend continuing therapy.  Pt to follow-up in 3 months or sooner if clinically indicated.  Patient advised to contact office with any questions, adverse effects, or acute worsening in signs and symptoms.   Anna Obrien was seen today for follow-up.  Diagnoses and all orders for this visit:  Attention deficit hyperactivity disorder (ADHD), predominantly inattentive type -     lisdexamfetamine (VYVANSE) 30 MG capsule; Take 1 capsule (30 mg total) by mouth daily. -     lisdexamfetamine (VYVANSE) 30 MG capsule; Take 1 capsule (30 mg total) by mouth daily. -     lisdexamfetamine (VYVANSE) 30 MG capsule; Take 1 capsule (30 mg total) by mouth  daily.  Recurrent major depressive disorder, remission status unspecified (HCC) -     buPROPion (WELLBUTRIN XL) 300 MG 24 hr tablet; Take 1 tablet (300 mg total) by mouth daily.  Acute stress reaction -     busPIRone (BUSPAR) 15 MG tablet; TAKE 1 TAB BY MOUTH TWICE DAILY -     propranolol (INDERAL) 10 MG tablet; Take 1 tablet (10 mg total) by mouth 3 (three) times daily.  Generalized anxiety disorder -     busPIRone (BUSPAR) 15 MG tablet; TAKE 1 TAB BY MOUTH TWICE DAILY -     citalopram (CELEXA) 40 MG tablet; Take 1 tablet (40 mg total) by mouth daily. -     propranolol (INDERAL) 10 MG tablet; Take 1 tablet (10 mg total) by mouth 3 (three) times daily.     Please see After Visit Summary for patient specific instructions.  No future appointments.  No orders of the defined types were placed in this encounter.     -------------------------------

## 2020-05-28 NOTE — Telephone Encounter (Signed)
Ms. shaquavia, whisonant are scheduled for a virtual visit with your provider today.    Just as we do with appointments in the office, we must obtain your consent to participate.  Your consent will be active for this visit and any virtual visit you may have with one of our providers in the next 365 days.    If you have a MyChart account, I can also send a copy of this consent to you electronically.  All virtual visits are billed to your insurance company just like a traditional visit in the office.  As this is a virtual visit, video technology does not allow for your provider to perform a traditional examination.  This may limit your provider's ability to fully assess your condition.  If your provider identifies any concerns that need to be evaluated in person or the need to arrange testing such as labs, EKG, etc, we will make arrangements to do so.    Although advances in technology are sophisticated, we cannot ensure that it will always work on either your end or our end.  If the connection with a video visit is poor, we may have to switch to a telephone visit.  With either a video or telephone visit, we are not always able to ensure that we have a secure connection.   I need to obtain your verbal consent now.   Are you willing to proceed with your visit today?   Evani Shrider has provided verbal consent on 05/28/2020 for a virtual visit (video or telephone).   Corie Chiquito, PMHNP 05/28/2020  8:28 AM

## 2020-11-24 ENCOUNTER — Other Ambulatory Visit: Payer: Self-pay | Admitting: Psychiatry

## 2020-11-24 DIAGNOSIS — F411 Generalized anxiety disorder: Secondary | ICD-10-CM

## 2020-11-24 DIAGNOSIS — F430 Acute stress reaction: Secondary | ICD-10-CM

## 2020-12-07 ENCOUNTER — Encounter: Payer: Self-pay | Admitting: Psychiatry

## 2020-12-07 ENCOUNTER — Other Ambulatory Visit: Payer: Self-pay

## 2020-12-07 ENCOUNTER — Ambulatory Visit: Payer: 59 | Admitting: Psychiatry

## 2020-12-07 DIAGNOSIS — F339 Major depressive disorder, recurrent, unspecified: Secondary | ICD-10-CM

## 2020-12-07 DIAGNOSIS — F411 Generalized anxiety disorder: Secondary | ICD-10-CM | POA: Diagnosis not present

## 2020-12-07 DIAGNOSIS — F430 Acute stress reaction: Secondary | ICD-10-CM | POA: Diagnosis not present

## 2020-12-07 DIAGNOSIS — F900 Attention-deficit hyperactivity disorder, predominantly inattentive type: Secondary | ICD-10-CM

## 2020-12-07 MED ORDER — BUPROPION HCL ER (XL) 300 MG PO TB24
300.0000 mg | ORAL_TABLET | Freq: Every day | ORAL | 0 refills | Status: DC
Start: 2020-12-07 — End: 2021-01-20

## 2020-12-07 MED ORDER — CITALOPRAM HYDROBROMIDE 20 MG PO TABS: 30.0000 mg | ORAL_TABLET | Freq: Every day | ORAL | 0 refills | Status: DC

## 2020-12-07 MED ORDER — BUSPIRONE HCL 15 MG PO TABS: ORAL_TABLET | ORAL | 0 refills | Status: DC

## 2020-12-07 MED ORDER — LISDEXAMFETAMINE DIMESYLATE 30 MG PO CAPS: 30.0000 mg | ORAL_CAPSULE | Freq: Every day | ORAL | 0 refills | Status: DC

## 2020-12-07 NOTE — Progress Notes (Signed)
Anna Obrien [id number] 08-28-1990 30 y.o.  Subjective:   [name] is a 30 y.o. (DOB 1990/05/16) female.  Chief Complaint:  Chief Complaint  Patient presents with   ADHD   Anxiety   Depression     HPI Anna Obrien presents to the office today for follow-up of anxiety, depression, ADHD, and sleep disturbance. She reports that she has been out of Vyvanse. She reports, "it's harder to get things done. I feel like I was a lot more productive and stable." She reports that she has had increased difficulty with concentration without Vyvanse. Easily distracted, particularly on tasks that she does not find as interesting. Has not taken Propranolol recently.   She reports anxiety has been "ok. I think I still over-think and analyze things a lot." She reports less feelings of dread and anticipatory anxiety has lessened. Denies recent panic. Occ intrusive memories in response to certain triggers. Denies recent nightmares. She reports hyper-vigilance has improved some and is now able to take her dog out at night. Remains very aware of her surroundings and looks in cars that are parked. "Not quite as jumpy." She reports that her mood was more depressed off medication. She reports feeling "burnt out" at work. She reports feeling very tired and motivation has been low. Reports feeling overwhelmed. Denies anhedonia and enjoys hanging out with friends. She reports that she has been having increased difficulty with sleep with difficulty falling asleep and then waking up around 2-3 am. Sleep has not been as good off Vyvanse. She has been prepping meals and at times is "very disciplined, regimented and then something happens" and will then eat different foods. Denies SI.   5 year anniversary of father's death is approaching.   Traveled a month ago and did not take medications for about 2 weeks. She reports that she could not stop crying.   Has been at current job almost 6 years. Reports increased  responsibilities without increase in compensation. Being asked to perform 2.5 job positions.   Seeing therapist, Anna Obrien, with [facility].   Past Psychiatric Medication Trials: Celexa- starting in early 2019 on 20 mg qd. Improved sleep and PMDD s/s. Unsure how helpful it has been for depression and anxiety in general. Wellbutrin XL Trazodone- Excessive somnolence Melatonin- Nightmares OTC sleep aids- ineffective Propranolol- helpful Buspar- helpful Dayvigo- helpful    GAD-7    Flowsheet Row Office Visit from 11/14/2019 in [facility]  Total GAD-7 Score 8        Review of Systems:  Review of Systems  Constitutional:  Positive for fatigue.  Cardiovascular:  Negative for palpitations.  Gastrointestinal: Negative.   Musculoskeletal:  Negative for gait problem.  Neurological:  Negative for tremors and headaches.  Psychiatric/Behavioral:         Please refer to HPI   Medications: I have reviewed the patient's current medications.  Current Outpatient Medications  Medication Sig Dispense Refill   Barberry-Oreg Grape-Goldenseal (BERBERINE COMPLEX PO) Take by mouth.     cholecalciferol (VITAMIN D3) 25 MCG (1000 UNIT) tablet Take 2,000 Units by mouth daily.      etonogestrel (NEXPLANON) 68 MG IMPL implant 1 each by Subdermal route once.     Multiple Vitamins-Minerals (WHOLE FOOD MULTIVITAMIN PO) Take by mouth.     buPROPion (WELLBUTRIN XL) 300 MG 24 hr tablet Take 1 tablet (300 mg total) by mouth daily. 90 tablet 0   busPIRone (BUSPAR) 15 MG tablet TAKE 1 TAB BY  MOUTH TWICE DAILY 180 tablet 0   citalopram (CELEXA) 20 MG tablet Take 1.5 tablets (30 mg total) by mouth daily. 135 tablet 0   lisdexamfetamine (VYVANSE) 30 MG capsule Take 1 capsule (30 mg total) by mouth daily. (Patient not taking: Reported on 12/07/2020) 30 capsule 0   [START ON 01/04/2021] lisdexamfetamine (VYVANSE) 30 MG capsule Take 1 capsule (30 mg total) by mouth daily. 30  capsule 0   lisdexamfetamine (VYVANSE) 30 MG capsule Take 1 capsule (30 mg total) by mouth daily. 30 capsule 0   spironolactone (ALDACTONE) 50 MG tablet Take 50 mg by mouth daily. (Patient not taking: Reported on 12/07/2020)     No current facility-administered medications for this visit.    Medication Side Effects: Other: Some affective dulling  Allergies: No Known Allergies  Past Medical History:  Diagnosis Date   Endometriosis    Vitamin D deficiency     Past Medical History, Surgical history, Social history, and Family history were reviewed and updated as appropriate.   Please see review of systems for further details on the patient's review from today.   Objective:   Physical Exam:  BP 110/66   Pulse 80   Wt 248 lb (112.5 kg)   BMI 40.03 kg/m   Physical Exam Constitutional:      General: She is not in acute distress. Musculoskeletal:        General: No deformity.  Neurological:     Mental Status: She is alert and oriented to person, place, and time.     Coordination: Coordination normal.  Psychiatric:        Attention and Perception: Attention and perception normal. She does not perceive auditory or visual hallucinations.        Mood and Affect: Affect is not labile, blunt, angry or inappropriate.        Speech: Speech normal.        Behavior: Behavior normal.        Thought Content: Thought content normal. Thought content is not paranoid or delusional. Thought content does not include homicidal or suicidal ideation. Thought content does not include homicidal or suicidal plan.        Cognition and Memory: Cognition and memory normal.        Judgment: Judgment normal.     Comments: Insight intact Mood presents as dysthymic and mildly anxious    Lab Review:  No results found for: NA, K, CL, CO2, GLUCOSE, BUN, CREATININE, CALCIUM, PROT, ALBUMIN, AST, ALT, ALKPHOS, BILITOT, GFRNONAA, GFRAA  No results found for: WBC, RBC, HGB, HCT, PLT, MCV, MCH, MCHC, RDW,  LYMPHSABS, MONOABS, EOSABS, BASOSABS  No results found for: POCLITH, LITHIUM   No results found for: PHENYTOIN, PHENOBARB, VALPROATE, CBMZ   .res Assessment: Plan:   Will re-start Vyvanse 30 mg po qd for ADHD. Discussed her concerns about affective dulling and being unable to cry with current medications. Discussed that affective dulling is typically dose related and may improve with decreasing dose of Citalopram or changing medication. Agreed to first attempt dose reduction of Citalopram to 30 mg po qd and then consider changing medication if she experiences worsening mood and anxiety s/s with lower dose.  Continue Wellbutrin XL 300 mg po qd for depression.  Continue Buspar 15 mg po BID for anxiety.  Pt to follow-up in 6 weeks or sooner if clinically indicated.  Recommend continuing therapy.  Patient advised to contact office with any questions, adverse effects, or acute worsening in signs and symptoms.  [employer]  was seen today for adhd, anxiety and depression.  Diagnoses and all orders for this visit:  Recurrent major depressive disorder, remission status unspecified (HCC) -     buPROPion (WELLBUTRIN XL) 300 MG 24 hr tablet; Take 1 tablet (300 mg total) by mouth daily.  Acute stress reaction -     busPIRone (BUSPAR) 15 MG tablet; TAKE 1 TAB BY MOUTH TWICE DAILY  Generalized anxiety disorder -     busPIRone (BUSPAR) 15 MG tablet; TAKE 1 TAB BY MOUTH TWICE DAILY -     citalopram (CELEXA) 20 MG tablet; Take 1.5 tablets (30 mg total) by mouth daily.  Attention deficit hyperactivity disorder (ADHD), predominantly inattentive type -     lisdexamfetamine (VYVANSE) 30 MG capsule; Take 1 capsule (30 mg total) by mouth daily. -     lisdexamfetamine (VYVANSE) 30 MG capsule; Take 1 capsule (30 mg total) by mouth daily.    Please see After Visit Summary for patient specific instructions.  Future Appointments  Date Time Provider Department Center  01/20/2021  8:30 AM Corie Chiquito, PMHNP  CP-[facility] None     No orders of the defined types were placed in this encounter.   -------------------------------

## 2021-01-12 ENCOUNTER — Other Ambulatory Visit: Payer: Self-pay | Admitting: Psychiatry

## 2021-01-12 DIAGNOSIS — F411 Generalized anxiety disorder: Secondary | ICD-10-CM

## 2021-01-20 ENCOUNTER — Ambulatory Visit (INDEPENDENT_AMBULATORY_CARE_PROVIDER_SITE_OTHER): Payer: 59 | Admitting: Psychiatry

## 2021-01-20 ENCOUNTER — Encounter: Payer: Self-pay | Admitting: Psychiatry

## 2021-01-20 ENCOUNTER — Other Ambulatory Visit: Payer: Self-pay

## 2021-01-20 DIAGNOSIS — F430 Acute stress reaction: Secondary | ICD-10-CM

## 2021-01-20 DIAGNOSIS — F900 Attention-deficit hyperactivity disorder, predominantly inattentive type: Secondary | ICD-10-CM | POA: Diagnosis not present

## 2021-01-20 DIAGNOSIS — F339 Major depressive disorder, recurrent, unspecified: Secondary | ICD-10-CM | POA: Diagnosis not present

## 2021-01-20 DIAGNOSIS — F411 Generalized anxiety disorder: Secondary | ICD-10-CM | POA: Diagnosis not present

## 2021-01-20 MED ORDER — LISDEXAMFETAMINE DIMESYLATE 30 MG PO CAPS: 30.0000 mg | ORAL_CAPSULE | Freq: Every day | ORAL | 0 refills | Status: DC

## 2021-01-20 MED ORDER — BUPROPION HCL ER (XL) 300 MG PO TB24: 300.0000 mg | ORAL_TABLET | Freq: Every day | ORAL | 0 refills | Status: DC

## 2021-01-20 MED ORDER — CITALOPRAM HYDROBROMIDE 20 MG PO TABS: 30.0000 mg | ORAL_TABLET | Freq: Every day | ORAL | 0 refills | Status: DC

## 2021-01-20 MED ORDER — BUSPIRONE HCL 15 MG PO TABS: ORAL_TABLET | ORAL | 0 refills | Status: DC

## 2021-01-20 NOTE — Progress Notes (Signed)
Anna Obrien [id number] [birth year] 30 y.o.  Subjective:   [name] is a 30 y.o. (DOB April 18, 1991) female.  Chief Complaint:  Chief Complaint  Patient presents with   Follow-up    Anxiety, depression, and ADHD    HPI Anna Obrien presents to the office today for follow-up of anxiety, depression, and ADHD. She reports that she is, "Stressed at work, but otherwise pretty good." She has been training for a new job and is assuming most of those responsibilities while still having the responsibilities of her long-standing position. Denies any other anxiety. Denies exaggerated startle response. Denies panic s/s.   Denies depressed mood. Improved energy and motivation. Started reading again and going to the gym. Spending more time with friends. She has started weight management program. Appetite has been ok. Denies any recent over-eating. She reports some challenges with sleep. Falling asleep easily and then wakes up a few hours later and thinking it is time to get up. She reports multiple awakenings for the last few weeks and thinks it may be related to work stress. Reports concentration is ok and has been able to read. Was able to be attentive through 2-day training conference. Increased enjoyment in things. Denies SI.   Reports less affective dulling since dose reduction in Celexa from 40 mg to 30 mg.   Has started seeing someone new and shared past experience and he has been supportive.   Continues to see therapist.    Past Psychiatric Medication Trials: Celexa- starting in early 2019 on 20 mg qd. Improved sleep and PMDD s/s. Unsure how helpful it has been for depression and anxiety in general. Wellbutrin XL Trazodone- Excessive somnolence Melatonin- Nightmares OTC sleep aids- ineffective Propranolol- helpful Buspar- helpful Dayvigo- helpful  GAD-7    Flowsheet Row Office Visit from 11/14/2019 in [facility]  Total GAD-7 Score 8        Review of  Systems:  Review of Systems  Cardiovascular:  Negative for palpitations.  Musculoskeletal:  Negative for gait problem.  Neurological:  Negative for tremors.  Psychiatric/Behavioral:         Please refer to HPI   Medications: I have reviewed the patient's current medications.  Current Outpatient Medications  Medication Sig Dispense Refill   Barberry-Oreg Grape-Goldenseal (BERBERINE COMPLEX PO) Take by mouth.     cholecalciferol (VITAMIN D3) 25 MCG (1000 UNIT) tablet Take 2,000 Units by mouth daily.      etonogestrel (NEXPLANON) 68 MG IMPL implant 1 each by Subdermal route once.     Multiple Vitamins-Minerals (WHOLE FOOD MULTIVITAMIN PO) Take by mouth.     buPROPion (WELLBUTRIN XL) 300 MG 24 hr tablet Take 1 tablet (300 mg total) by mouth daily. 90 tablet 0   busPIRone (BUSPAR) 15 MG tablet TAKE 1 TAB BY MOUTH TWICE DAILY 180 tablet 0   citalopram (CELEXA) 20 MG tablet Take 1.5 tablets (30 mg total) by mouth daily. 135 tablet 0   [START ON 04/14/2021] lisdexamfetamine (VYVANSE) 30 MG capsule Take 1 capsule (30 mg total) by mouth daily. 30 capsule 0   [START ON 03/17/2021] lisdexamfetamine (VYVANSE) 30 MG capsule Take 1 capsule (30 mg total) by mouth daily. 30 capsule 0   [START ON 02/17/2021] lisdexamfetamine (VYVANSE) 30 MG capsule Take 1 capsule (30 mg total) by mouth daily. 30 capsule 0   spironolactone (ALDACTONE) 50 MG tablet Take 50 mg by mouth daily. (Patient not taking: No sig reported)     No current facility-administered medications for this  visit.    Medication Side Effects: Other: Rare tremors  Allergies: No Known Allergies  Past Medical History:  Diagnosis Date   Endometriosis    Vitamin D deficiency     Past Medical History, Surgical history, Social history, and Family history were reviewed and updated as appropriate.   Please see review of systems for further details on the patient's review from today.   Objective:   Physical Exam:  BP 117/67   Pulse 80    Physical Exam Constitutional:      General: She is not in acute distress. Musculoskeletal:        General: No deformity.  Neurological:     Mental Status: She is alert and oriented to person, place, and time.     Coordination: Coordination normal.  Psychiatric:        Attention and Perception: Attention and perception normal. She does not perceive auditory or visual hallucinations.        Mood and Affect: Mood normal. Mood is not anxious or depressed. Affect is not labile, blunt, angry or inappropriate.        Speech: Speech normal.        Behavior: Behavior normal.        Thought Content: Thought content normal. Thought content is not paranoid or delusional. Thought content does not include homicidal or suicidal ideation. Thought content does not include homicidal or suicidal plan.        Cognition and Memory: Cognition and memory normal.        Judgment: Judgment normal.     Comments: Insight intact    Lab Review:  No results found for: NA, K, CL, CO2, GLUCOSE, BUN, CREATININE, CALCIUM, PROT, ALBUMIN, AST, ALT, ALKPHOS, BILITOT, GFRNONAA, GFRAA  No results found for: WBC, RBC, HGB, HCT, PLT, MCV, MCH, MCHC, RDW, LYMPHSABS, MONOABS, EOSABS, BASOSABS  No results found for: POCLITH, LITHIUM   No results found for: PHENYTOIN, PHENOBARB, VALPROATE, CBMZ   .res Assessment: Plan:   Will continue current plan of care since target signs and symptoms are well controlled without any tolerability issues. Continue Vyvanse 30 mg po qd for ADHD.  Continue Wellbutrin XL 300 mg po qd for depression. Continue Celexa 30 mg po qd for anxiety and depression.  Continue Buspar 15 mg po BID for anxiety.  Recommend continuing psychotherapy.  Pt to follow-up in 3 months or sooner if clinically indicated.  Patient advised to contact office with any questions, adverse effects, or acute worsening in signs and symptoms.   Anna Obrien was seen today for follow-up.  Diagnoses and all orders for this  visit:  Attention deficit hyperactivity disorder (ADHD), predominantly inattentive type -     lisdexamfetamine (VYVANSE) 30 MG capsule; Take 1 capsule (30 mg total) by mouth daily. -     lisdexamfetamine (VYVANSE) 30 MG capsule; Take 1 capsule (30 mg total) by mouth daily. -     lisdexamfetamine (VYVANSE) 30 MG capsule; Take 1 capsule (30 mg total) by mouth daily.  Recurrent major depressive disorder, remission status unspecified (HCC) -     buPROPion (WELLBUTRIN XL) 300 MG 24 hr tablet; Take 1 tablet (300 mg total) by mouth daily.  Acute stress reaction -     busPIRone (BUSPAR) 15 MG tablet; TAKE 1 TAB BY MOUTH TWICE DAILY  Generalized anxiety disorder -     busPIRone (BUSPAR) 15 MG tablet; TAKE 1 TAB BY MOUTH TWICE DAILY -     citalopram (CELEXA) 20 MG tablet; Take 1.5 tablets (30  mg total) by mouth daily.    Please see After Visit Summary for patient specific instructions.  Future Appointments  Date Time Provider Department Center  04/27/2021  8:30 AM Corie Chiquito, PMHNP CP-CP None    No orders of the defined types were placed in this encounter.   -------------------------------

## 2021-04-21 ENCOUNTER — Ambulatory Visit
Admission: RE | Admit: 2021-04-21 | Discharge: 2021-04-21 | Disposition: A | Discharge status: Home / Self Care | Payer: BC Managed Care – PPO | Source: Ambulatory Visit | Attending: Family Medicine | Admitting: Family Medicine

## 2021-04-21 ENCOUNTER — Other Ambulatory Visit: Payer: Self-pay

## 2021-04-21 ENCOUNTER — Other Ambulatory Visit: Payer: Self-pay | Admitting: Family Medicine

## 2021-04-21 DIAGNOSIS — M25562 Pain in left knee: Secondary | ICD-10-CM

## 2021-04-27 ENCOUNTER — Telehealth (INDEPENDENT_AMBULATORY_CARE_PROVIDER_SITE_OTHER): Payer: 59 | Admitting: Psychiatry

## 2021-04-27 ENCOUNTER — Encounter: Payer: Self-pay | Admitting: Psychiatry

## 2021-04-27 VITALS — BP 140/80 | HR 90 | Wt 238.0 lb

## 2021-04-27 DIAGNOSIS — F900 Attention-deficit hyperactivity disorder, predominantly inattentive type: Secondary | ICD-10-CM

## 2021-04-27 DIAGNOSIS — F430 Acute stress reaction: Secondary | ICD-10-CM

## 2021-04-27 DIAGNOSIS — F339 Major depressive disorder, recurrent, unspecified: Secondary | ICD-10-CM

## 2021-04-27 DIAGNOSIS — F411 Generalized anxiety disorder: Secondary | ICD-10-CM

## 2021-04-27 MED ORDER — LISDEXAMFETAMINE DIMESYLATE 40 MG PO CAPS: 40.0000 mg | ORAL_CAPSULE | ORAL | 0 refills | Status: DC

## 2021-04-27 MED ORDER — BUSPIRONE HCL 15 MG PO TABS: ORAL_TABLET | ORAL | 1 refills | Status: DC

## 2021-04-27 MED ORDER — CITALOPRAM HYDROBROMIDE 20 MG PO TABS: 30.0000 mg | ORAL_TABLET | Freq: Every day | ORAL | 1 refills | Status: DC

## 2021-04-27 MED ORDER — BUPROPION HCL ER (XL) 300 MG PO TB24: 300.0000 mg | ORAL_TABLET | Freq: Every day | ORAL | 1 refills | Status: DC

## 2021-04-27 NOTE — Progress Notes (Signed)
Janine Reller [id number] 1990/06/28 30 y.o.  Virtual Visit via Video Note  I connected with pt @ on 04/27/21 at  8:30 AM EST by a video enabled telemedicine application and verified that I am speaking with the correct person using two identifiers.   I discussed the limitations of evaluation and management by telemedicine and the availability of in person appointments. The patient expressed understanding and agreed to proceed.  I discussed the assessment and treatment plan with the patient. The patient was provided an opportunity to ask questions and all were answered. The patient agreed with the plan and demonstrated an understanding of the instructions.   The patient was advised to call back or seek an in-person evaluation if the symptoms worsen or if the condition fails to improve as anticipated.  I provided 30 minutes of non-face-to-face time during this encounter.  The patient was located at home.  The provider was located at [facility].   Corie Chiquito, PMHNP   Subjective:   [name] is a 30 y.o. (DOB 07-23-1990) female.  Chief Complaint:  Chief Complaint  Patient Obrien with   Other    Impulsive eating/binge eating    HPI Anna Obrien Obrien for follow-up of anxiety, depression, ADHD, and binge eating. She reports that she had a "few down days" around the holidays with missing her father. She reports that she has had some work related anxiety and worry. Denies panic s/s. She reports that she has experienced some occ hypervigilance. Denies nightmares. Infrequent intrusive memories. Concentration has been "mostly good" and improved with Vyvanse. Lost 15 lbs with weight management clinic. She reports that she has some impulsive eating at times. She reports that at times she impulsively eats off plan and occasionally eats larger quantities.  She reports that her appetite has been "pretty good" otherwise. She reports some sleep disturbance at times in  response to work stress. Energy and motivation have been good. Has been going to the gym regularly. Denies SI.   Work stress. Reports that her relationship is going well and spent Christmas with him and his family.    Review of Systems:  Review of Systems  Cardiovascular:  Negative for palpitations.  Musculoskeletal:  Negative for gait problem.  Neurological:  Negative for tremors.  Psychiatric/Behavioral:         Please refer to HPI   Medications: I have reviewed the patient's current medications.  Current Outpatient Medications  Medication Sig Dispense Refill   cholecalciferol (VITAMIN D3) 25 MCG (1000 UNIT) tablet Take 2,000 Units by mouth daily.      etonogestrel (NEXPLANON) 68 MG IMPL implant 1 each by Subdermal route once.     lisdexamfetamine (VYVANSE) 40 MG capsule Take 1 capsule (40 mg total) by mouth every morning. 30 capsule 0   MAGNESIUM CITRATE PO Take by mouth at bedtime as needed.     Multiple Vitamins-Minerals (WHOLE FOOD MULTIVITAMIN PO) Take by mouth.     buPROPion (WELLBUTRIN XL) 300 MG 24 hr tablet Take 1 tablet (300 mg total) by mouth daily. 90 tablet 1   busPIRone (BUSPAR) 15 MG tablet TAKE 1 TAB BY MOUTH TWICE DAILY 180 tablet 1   citalopram (CELEXA) 20 MG tablet Take 1.5 tablets (30 mg total) by mouth daily. 135 tablet 1   spironolactone (ALDACTONE) 50 MG tablet Take 50 mg by mouth daily. (Patient not taking: No sig reported)     No current facility-administered medications for this visit.    Medication Side Effects:  None  Allergies: No Known Allergies  Past Medical History:  Diagnosis Date   Endometriosis    Vitamin D deficiency     Family History  Problem Relation Age of Onset   Depression Mother    Diabetes Father    Heart attack Father    Anxiety disorder Sister    Depression Sister     Social History   Socioeconomic History   Marital status: Single    Spouse name: Not on file   Number of children: Not on file   Years of education:  Not on file   Highest education level: Not on file  Occupational History   Not on file  Tobacco Use   Smoking status: Never   Smokeless tobacco: Never  Substance and Sexual Activity   Alcohol use: Yes    Comment: occ   Drug use: No   Sexual activity: Not on file  Other Topics Concern   Not on file  Social History Narrative   Not on file   Social Determinants of Health   Financial Resource Strain: Not on file  Food Insecurity: Not on file  Transportation Needs: Not on file  Physical Activity: Not on file  Stress: Not on file  Social Connections: Not on file  Intimate Partner Violence: Not on file    Past Medical History, Surgical history, Social history, and Family history were reviewed and updated as appropriate.   Please see review of systems for further details on the patient's review from today.   Objective:   Physical Exam:  BP 140/80    Pulse 90    Wt 238 lb (108 kg)    BMI 38.41 kg/m   Physical Exam Neurological:     Mental Status: She is alert and oriented to person, place, and time.     Cranial Nerves: No dysarthria.  Psychiatric:        Attention and Perception: Attention and perception normal.        Mood and Affect: Mood normal.        Speech: Speech normal.        Behavior: Behavior is cooperative.        Thought Content: Thought content normal. Thought content is not paranoid or delusional. Thought content does not include homicidal or suicidal ideation. Thought content does not include homicidal or suicidal plan.        Cognition and Memory: Cognition and memory normal.        Judgment: Judgment normal.     Comments: Insight intact    Lab Review:  No results found for: NA, K, CL, CO2, GLUCOSE, BUN, CREATININE, CALCIUM, PROT, ALBUMIN, AST, ALT, ALKPHOS, BILITOT, GFRNONAA, GFRAA  No results found for: WBC, RBC, HGB, HCT, PLT, MCV, MCH, MCHC, RDW, LYMPHSABS, MONOABS, EOSABS, BASOSABS  No results found for: POCLITH, LITHIUM   No results found for:  PHENYTOIN, PHENOBARB, VALPROATE, CBMZ   .res Assessment: Plan:   Pt seen for 30 minutes and time spent discussing bariatric provider's recommendation to consider increase in Vyvanse. Reviewed record/note from visit with bariatric provider. Agree that increase in Vyvanse to 40 mg po qd may be helpful for binge eating and ADHD s/s since 30 mg has been partially effective for these s/s. Discussed potential benefits, risks, and side effects with pt and she is amenable to increase in Vyvanse.  Continue Wellbutrin XL 300 mg for depression.  Continue Buspar 15 mg po BID for anxiety.  Continue Celexa 30 mg po qd for anxiety and  depression.  Pt to follow-up in 3 months or sooner if clinically indicated.  Patient advised to contact office with any questions, adverse effects, or acute worsening in signs and symptoms.  Anna Obrien was seen today for other.  Diagnoses and all orders for this visit:  Attention deficit hyperactivity disorder (ADHD), predominantly inattentive type -     lisdexamfetamine (VYVANSE) 40 MG capsule; Take 1 capsule (40 mg total) by mouth every morning.  Recurrent major depressive disorder, remission status unspecified (HCC) -     buPROPion (WELLBUTRIN XL) 300 MG 24 hr tablet; Take 1 tablet (300 mg total) by mouth daily.  Acute stress reaction -     busPIRone (BUSPAR) 15 MG tablet; TAKE 1 TAB BY MOUTH TWICE DAILY  Generalized anxiety disorder -     busPIRone (BUSPAR) 15 MG tablet; TAKE 1 TAB BY MOUTH TWICE DAILY -     citalopram (CELEXA) 20 MG tablet; Take 1.5 tablets (30 mg total) by mouth daily.     Please see After Visit Summary for patient specific instructions.  No future appointments.  No orders of the defined types were placed in this encounter.     -------------------------------

## 2021-05-16 MED ORDER — LISDEXAMFETAMINE DIMESYLATE 40 MG PO CAPS: 40.0000 mg | ORAL_CAPSULE | ORAL | 0 refills | Status: DC

## 2021-07-26 ENCOUNTER — Ambulatory Visit: Payer: 59 | Admitting: Psychiatry

## 2021-07-29 ENCOUNTER — Encounter: Payer: Self-pay | Admitting: Psychiatry

## 2021-07-29 ENCOUNTER — Ambulatory Visit (INDEPENDENT_AMBULATORY_CARE_PROVIDER_SITE_OTHER): Payer: 59 | Admitting: Psychiatry

## 2021-07-29 DIAGNOSIS — F339 Major depressive disorder, recurrent, unspecified: Secondary | ICD-10-CM | POA: Diagnosis not present

## 2021-07-29 DIAGNOSIS — F900 Attention-deficit hyperactivity disorder, predominantly inattentive type: Secondary | ICD-10-CM | POA: Diagnosis not present

## 2021-07-29 DIAGNOSIS — F411 Generalized anxiety disorder: Secondary | ICD-10-CM

## 2021-07-29 MED ORDER — LISDEXAMFETAMINE DIMESYLATE 40 MG PO CAPS: 40.0000 mg | ORAL_CAPSULE | ORAL | 0 refills | Status: DC

## 2021-07-29 MED ORDER — CITALOPRAM HYDROBROMIDE 20 MG PO TABS: 30.0000 mg | ORAL_TABLET | Freq: Every day | ORAL | 0 refills | Status: DC

## 2021-07-29 MED ORDER — BUPROPION HCL ER (XL) 300 MG PO TB24: 300.0000 mg | ORAL_TABLET | Freq: Every day | ORAL | 1 refills | Status: DC

## 2021-07-29 NOTE — Progress Notes (Signed)
Anna Obrien ?[id number] ?1991-03-23 ?31 y.o. ? ?Subjective:  ? ?[name] is a 31 y.o. (DOB 04-May-1990) female. ? ?Chief Complaint:  ?Chief Complaint  ?Patient presents with  ? Follow-up  ?  Anxiety, depression, ADHD, and binge eating  ? ? ?HPI ?Anna Obrien presents to the office today for follow-up of anxiety, depression, ADHD, and binge eating. She reports that she has been doing well overall. She denies any recent anxiety other than mild situational anxiety. She reports that her mood has been ok overall. Denies persistent sad mood. Energy as been ok. She reports difficulty getting out of bed in the morning and then energy and motivation are ok as the day progresses. Appetite has been "normal." Denies binge eating or emotional feeing. Meal prepping less. She reports that she has been less stressed about what she is eating. She has lost about 18 lbs since starting weight management program in October. Concentration has been ok. Sleeping well. She reports that she does not feel rested in the mornings. Never had sleep study. Has not been told that she snores or has apnea. Denies SI.  ? ?She reports that transition at work has gone well and she is enjoying her work. Learning new role. Relationship has been going well.  ? ?Has decreased therapy frequency.  ? ?Has been taking Buspar only in the morning after forgetting second dose and not seeing any change.  ? ?Vyvanse last filled 07/08/21. ? ?Past Psychiatric Medication Trials: ?Celexa- starting in early 2019 on 20 mg qd. Improved sleep and PMDD s/s. Unsure how helpful it has been for depression and anxiety in general. ?Wellbutrin XL ?Trazodone- Excessive somnolence ?Melatonin- Nightmares ?OTC sleep aids- ineffective ?Propranolol- helpful ?Buspar- helpful ?Dayvigo- helpful ?  ? ? ?GAD-7   ? ?Flowsheet Row Office Visit from 11/14/2019 in [facility]  ?Total GAD-7 Score 8  ? ?  ?  ? ?Review of Systems:  ?Review of Systems  ?Cardiovascular:   Negative for palpitations.  ?Musculoskeletal:  Negative for gait problem.  ?Neurological:  Negative for tremors.  ?Psychiatric/Behavioral:    ?     Please refer to HPI  ? ?Medications: I have reviewed the patient's current medications. ? ?Current Outpatient Medications  ?Medication Sig Dispense Refill  ? busPIRone (BUSPAR) 15 MG tablet TAKE 1 TAB BY MOUTH TWICE DAILY (Patient taking differently: Take 15 mg by mouth in the morning. TAKE 1 TAB BY MOUTH TWICE DAILY) 180 tablet 1  ? cholecalciferol (VITAMIN D3) 25 MCG (1000 UNIT) tablet Take 2,000 Units by mouth daily.     ? etonogestrel (NEXPLANON) 68 MG IMPL implant 1 each by Subdermal route once.    ? MAGNESIUM CITRATE PO Take by mouth at bedtime as needed.    ? Multiple Vitamins-Minerals (WHOLE FOOD MULTIVITAMIN PO) Take by mouth.    ? spironolactone (ALDACTONE) 50 MG tablet Take 50 mg by mouth daily.    ? buPROPion (WELLBUTRIN XL) 300 MG 24 hr tablet Take 1 tablet (300 mg total) by mouth daily. 90 tablet 1  ? citalopram (CELEXA) 20 MG tablet Take 1.5 tablets (30 mg total) by mouth daily. 135 tablet 0  ? lisdexamfetamine (VYVANSE) 40 MG capsule Take 1 capsule (40 mg total) by mouth every morning. 30 capsule 0  ? [START ON 09/30/2021] lisdexamfetamine (VYVANSE) 40 MG capsule Take 1 capsule (40 mg total) by mouth every morning. 30 capsule 0  ? [START ON 09/02/2021] lisdexamfetamine (VYVANSE) 40 MG capsule Take 1 capsule (40 mg total) by  mouth every morning. 30 capsule 0  ? ?No current facility-administered medications for this visit.  ? ? ?Medication Side Effects: None ? ?Allergies: No Known Allergies ? ?Past Medical History:  ?Diagnosis Date  ? Endometriosis   ? Vitamin D deficiency   ? ? ?Past Medical History, Surgical history, Social history, and Family history were reviewed and updated as appropriate.  ? ?Please see review of systems for further details on the patient's review from today.  ? ?Objective:  ? ?Physical Exam:  ?BP 115/66   Pulse 78   Wt 234 lb (106.1  kg)   BMI 37.77 kg/m?  ? ?Physical Exam ?Constitutional:   ?   General: She is not in acute distress. ?Musculoskeletal:     ?   General: No deformity.  ?Neurological:  ?   Mental Status: She is alert and oriented to person, place, and time.  ?   Coordination: Coordination normal.  ?Psychiatric:     ?   Attention and Perception: Attention and perception normal. She does not perceive auditory or visual hallucinations.     ?   Mood and Affect: Mood normal. Mood is not anxious or depressed. Affect is not labile, blunt, angry or inappropriate.     ?   Speech: Speech normal.     ?   Behavior: Behavior normal.     ?   Thought Content: Thought content normal. Thought content is not paranoid or delusional. Thought content does not include homicidal or suicidal ideation. Thought content does not include homicidal or suicidal plan.     ?   Cognition and Memory: Cognition and memory normal.     ?   Judgment: Judgment normal.  ?   Comments: Insight intact  ? ? ?Lab Review:  ?No results found for: NA, K, CL, CO2, GLUCOSE, BUN, CREATININE, CALCIUM, PROT, ALBUMIN, AST, ALT, ALKPHOS, BILITOT, GFRNONAA, GFRAA ? ?No results found for: WBC, RBC, HGB, HCT, PLT, MCV, MCH, MCHC, RDW, LYMPHSABS, MONOABS, EOSABS, BASOSABS ? ?No results found for: POCLITH, LITHIUM  ? ?No results found for: PHENYTOIN, PHENOBARB, VALPROATE, CBMZ  ? ?.res ?Assessment: Plan:   ?Will continue current plan of care since target signs and symptoms are well controlled without any tolerability issues. ?Continue Celexa 30 mg po qd for anxiety and depression.  ?Continue Wellbutrin XL 300 mg po qd for depression.  ?Continue Vyvanse 40 mg po qd for ADHD and binge eating.  ?Continue Buspar 15 mg every morning for anxiety. Discussed option of discontinuing Buspar. She reports that she prefers to continue Buspar at this time. ?Pt to follow-up in 3 months or sooner if clinically indicated.  ?Patient advised to contact office with any questions, adverse effects, or acute  worsening in signs and symptoms.  ? ? ?Anna Obrien was seen today for follow-up. ? ?Diagnoses and all orders for this visit: ? ?Recurrent major depressive disorder, remission status unspecified (HCC) ?-     buPROPion (WELLBUTRIN XL) 300 MG 24 hr tablet; Take 1 tablet (300 mg total) by mouth daily. ? ?Generalized anxiety disorder ?-     citalopram (CELEXA) 20 MG tablet; Take 1.5 tablets (30 mg total) by mouth daily. ? ?Attention deficit hyperactivity disorder (ADHD), predominantly inattentive type ?-     lisdexamfetamine (VYVANSE) 40 MG capsule; Take 1 capsule (40 mg total) by mouth every morning. ?-     lisdexamfetamine (VYVANSE) 40 MG capsule; Take 1 capsule (40 mg total) by mouth every morning. ? ?  ? ?Please see After Visit Summary for  patient specific instructions. ? ?Future Appointments  ?Date Time Provider Department Center  ?10/28/2021  8:30 AM Corie Chiquito, PMHNP [facility] None  ? ? ?No orders of the defined types were placed in this encounter. ? ? ?------------------------------- ?

## 2021-10-28 ENCOUNTER — Encounter: Payer: Self-pay | Admitting: Psychiatry

## 2021-10-28 ENCOUNTER — Other Ambulatory Visit: Payer: Self-pay | Admitting: Psychiatry

## 2021-10-28 ENCOUNTER — Ambulatory Visit (INDEPENDENT_AMBULATORY_CARE_PROVIDER_SITE_OTHER): Payer: 59 | Admitting: Psychiatry

## 2021-10-28 DIAGNOSIS — F339 Major depressive disorder, recurrent, unspecified: Secondary | ICD-10-CM | POA: Diagnosis not present

## 2021-10-28 DIAGNOSIS — F900 Attention-deficit hyperactivity disorder, predominantly inattentive type: Secondary | ICD-10-CM

## 2021-10-28 DIAGNOSIS — F430 Acute stress reaction: Secondary | ICD-10-CM

## 2021-10-28 DIAGNOSIS — F411 Generalized anxiety disorder: Secondary | ICD-10-CM

## 2021-10-28 MED ORDER — LISDEXAMFETAMINE DIMESYLATE 40 MG PO CAPS: 40.0000 mg | ORAL_CAPSULE | ORAL | 0 refills | Status: DC

## 2021-10-28 MED ORDER — BUPROPION HCL ER (XL) 300 MG PO TB24: 300.0000 mg | ORAL_TABLET | Freq: Every day | ORAL | 1 refills | Status: DC

## 2021-10-28 MED ORDER — CITALOPRAM HYDROBROMIDE 20 MG PO TABS: 30.0000 mg | ORAL_TABLET | Freq: Every day | ORAL | 1 refills | Status: DC

## 2021-10-28 MED ORDER — BUSPIRONE HCL 15 MG PO TABS: 15.0000 mg | ORAL_TABLET | Freq: Every day | ORAL | 1 refills | Status: DC

## 2021-10-28 NOTE — Progress Notes (Signed)
Anna Obrien [id number] [birth year] 31 y.o.  Virtual Visit via Telephone Note  I connected with pt on 10/28/21 at  8:30 AM EDT by telephone and verified that I am speaking with the correct person using two identifiers.   I discussed the limitations, risks, security and privacy concerns of performing an evaluation and management service by telephone and the availability of in person appointments. I also discussed with the patient that there may be a patient responsible charge related to this service. The patient expressed understanding and agreed to proceed.   I discussed the assessment and treatment plan with the patient. The patient was provided an opportunity to ask questions and all were answered. The patient agreed with the plan and demonstrated an understanding of the instructions.   The patient was advised to call back or seek an in-person evaluation if the symptoms worsen or if the condition fails to improve as anticipated.  I provided 25 minutes of non-face-to-face time during this encounter.  The patient was located in [place], [place].  The provider was located at [facility].   Corie Chiquito, PMHNP   Subjective:   [name] is a 31 y.o. (DOB 1990/05/18) female.  Chief Complaint:  Chief Complaint  Patient presents with   Follow-up    Anxiety, depression, ADHD, and binge eating    HPI Anna Obrien presents for follow-up of anxiety, depression, ADHD, and binge eating.  "Things overall have been ok." She reports that her mood has been stable. Denies depressed mood- "first time I have been consistently happy for as long of a time as I can remember." She denies anxiety other than occasional work stress. Sleeping well most nights. Some difficulty waking up in the mornings and has been trying to go to bed earlier and get out of bed when the alarm first goes off. Appetite has been "ok." She would like to get back on health plan. Denies any recent binge eating.  Energy and motivation have been good. Feels that she is able to be productive at work and at home. Has not been going to gym as often. Concentration has been adequate. Able to focus on her job, even when working from home. Has started reading some for enjoyment. Denies SI.   Started new role in March and this has gone well overall. Enjoying the work she is doing and her team. Has the ability to work remotely some days. Spending time in [place] at boyfriend's.    Past Psychiatric Medication Trials: Celexa- starting in early 2019 on 20 mg qd. Improved sleep and PMDD s/s. Unsure how helpful it has been for depression and anxiety in general. Wellbutrin XL Trazodone- Excessive somnolence Melatonin- Nightmares OTC sleep aids- ineffective Propranolol- helpful Buspar- helpful Dayvigo- helpful    Review of Systems:  Review of Systems  Cardiovascular:  Negative for palpitations.  Musculoskeletal:  Negative for gait problem.  Neurological:  Negative for tremors and headaches.  Psychiatric/Behavioral:         Please refer to HPI    Medications: I have reviewed the patient's current medications.  Current Outpatient Medications  Medication Sig Dispense Refill   cholecalciferol (VITAMIN D3) 25 MCG (1000 UNIT) tablet Take 2,000 Units by mouth daily.      etonogestrel (NEXPLANON) 68 MG IMPL implant 1 each by Subdermal route once.     [START ON 01/20/2022] lisdexamfetamine (VYVANSE) 40 MG capsule Take 1 capsule (40 mg total) by mouth every morning. 30 capsule 0   Multiple Vitamins-Minerals (WHOLE  FOOD MULTIVITAMIN PO) Take by mouth.     spironolactone (ALDACTONE) 50 MG tablet Take 50 mg by mouth daily.     UNABLE TO FIND Med Name: "Calm" pills as needed (Magnesium, [place], Melatonin, L-threonate)     buPROPion (WELLBUTRIN XL) 300 MG 24 hr tablet Take 1 tablet (300 mg total) by mouth daily. 90 tablet 1   busPIRone (BUSPAR) 15 MG tablet Take 1 tablet (15 mg total) by mouth daily. TAKE 1 TAB BY  MOUTH TWICE DAILY 90 tablet 1   citalopram (CELEXA) 20 MG tablet Take 1.5 tablets (30 mg total) by mouth daily. 135 tablet 1   lisdexamfetamine (VYVANSE) 40 MG capsule Take 1 capsule (40 mg total) by mouth every morning. 30 capsule 0   [START ON 11/25/2021] lisdexamfetamine (VYVANSE) 40 MG capsule Take 1 capsule (40 mg total) by mouth every morning. 30 capsule 0   [START ON 12/23/2021] lisdexamfetamine (VYVANSE) 40 MG capsule Take 1 capsule (40 mg total) by mouth every morning. 30 capsule 0   MAGNESIUM CITRATE PO Take by mouth at bedtime as needed. (Patient not taking: Reported on 10/28/2021)     No current facility-administered medications for this visit.    Medication Side Effects: None  Allergies: No Known Allergies  Past Medical History:  Diagnosis Date   Endometriosis    Vitamin D deficiency     Family History  Problem Relation Age of Onset   Depression Mother    Sleep apnea Father    Diabetes Father    Heart attack Father    Anxiety disorder Sister    Depression Sister    Sleep apnea Sister     Social History   Socioeconomic History   Marital status: Single    Spouse name: Not on file   Number of children: Not on file   Years of education: Not on file   Highest education level: Not on file  Occupational History   Not on file  Tobacco Use   Smoking status: Never   Smokeless tobacco: Never  Substance and Sexual Activity   Alcohol use: Yes    Comment: occ   Drug use: No   Sexual activity: Not on file  Other Topics Concern   Not on file  Social History Narrative   Not on file   Social Determinants of Health   Financial Resource Strain: Not on file  Food Insecurity: Not on file  Transportation Needs: Not on file  Physical Activity: Not on file  Stress: Not on file  Social Connections: Not on file  Intimate Partner Violence: Not on file    Past Medical History, Surgical history, Social history, and Family history were reviewed and updated as appropriate.    Please see review of systems for further details on the patient's review from today.   Objective:   Physical Exam:  Wt 245 lb (111.1 kg)   BMI 39.54 kg/m   Physical Exam Neurological:     Mental Status: She is alert and oriented to person, place, and time.     Cranial Nerves: No dysarthria.  Psychiatric:        Attention and Perception: Attention and perception normal.        Mood and Affect: Mood normal.        Speech: Speech normal.        Behavior: Behavior is cooperative.        Thought Content: Thought content normal. Thought content is not paranoid or delusional. Thought content does not include  homicidal or suicidal ideation. Thought content does not include homicidal or suicidal plan.        Cognition and Memory: Cognition and memory normal.        Judgment: Judgment normal.     Comments: Insight intact     Lab Review:  No results found for: "NA", "K", "CL", "CO2", "GLUCOSE", "BUN", "CREATININE", "CALCIUM", "PROT", "ALBUMIN", "AST", "ALT", "ALKPHOS", "BILITOT", "GFRNONAA", "GFRAA"  No results found for: "WBC", "RBC", "HGB", "HCT", "PLT", "MCV", "MCH", "MCHC", "RDW", "LYMPHSABS", "MONOABS", "EOSABS", "BASOSABS"  No results found for: "POCLITH", "LITHIUM"   No results found for: "PHENYTOIN", "PHENOBARB", "VALPROATE", "CBMZ"   .res Assessment: Plan:   Pt seen for 25 minutes and time spent reviewing treatment plan. She reports that her mood and anxiety symptoms are currently very well controlled and "this is the best I have felt in awhile." Will continue current plan of care since target signs and symptoms are well controlled without any tolerability issues. Continue Celexa 30 mg po qd for anxiety and depression.  Continue Wellbutrin XL 300 mg po qd for depression.  Continue Vyvanse 40 mg po qd for ADHD and binge eating.  Continue Buspar 15 mg every morning for anxiety. Pt to follow-up in 6 months or sooner if clinically indicated.  Requested pt call in 3 months to  provide update and request additional scripts.  Patient advised to contact office with any questions, adverse effects, or acute worsening in signs and symptoms.  Anna Obrien was seen today for follow-up.  Diagnoses and all orders for this visit:  Recurrent major depressive disorder, remission status unspecified (HCC) -     buPROPion (WELLBUTRIN XL) 300 MG 24 hr tablet; Take 1 tablet (300 mg total) by mouth daily.  Acute stress reaction -     busPIRone (BUSPAR) 15 MG tablet; Take 1 tablet (15 mg total) by mouth daily. TAKE 1 TAB BY MOUTH TWICE DAILY  Generalized anxiety disorder -     busPIRone (BUSPAR) 15 MG tablet; Take 1 tablet (15 mg total) by mouth daily. TAKE 1 TAB BY MOUTH TWICE DAILY -     citalopram (CELEXA) 20 MG tablet; Take 1.5 tablets (30 mg total) by mouth daily.  Attention deficit hyperactivity disorder (ADHD), predominantly inattentive type -     lisdexamfetamine (VYVANSE) 40 MG capsule; Take 1 capsule (40 mg total) by mouth every morning. -     lisdexamfetamine (VYVANSE) 40 MG capsule; Take 1 capsule (40 mg total) by mouth every morning. -     lisdexamfetamine (VYVANSE) 40 MG capsule; Take 1 capsule (40 mg total) by mouth every morning.    Please see After Visit Summary for patient specific instructions.  No future appointments.  No orders of the defined types were placed in this encounter.     -------------------------------

## 2021-11-03 ENCOUNTER — Telehealth: Payer: Self-pay | Admitting: Psychiatry

## 2021-11-03 ENCOUNTER — Other Ambulatory Visit: Payer: Self-pay

## 2021-11-03 DIAGNOSIS — F900 Attention-deficit hyperactivity disorder, predominantly inattentive type: Secondary | ICD-10-CM

## 2021-11-03 MED ORDER — LISDEXAMFETAMINE DIMESYLATE 40 MG PO CAPS: 40.0000 mg | ORAL_CAPSULE | ORAL | 0 refills | Status: DC

## 2021-11-03 NOTE — Telephone Encounter (Signed)
Pharmacy lvm asking for [place] to resend the July script of vyvanse to them. It accidentally got deleted on their end

## 2021-11-03 NOTE — Telephone Encounter (Signed)
Pended.

## 2022-05-04 ENCOUNTER — Telehealth: Payer: Self-pay | Admitting: Psychiatry

## 2022-05-04 ENCOUNTER — Other Ambulatory Visit: Payer: Self-pay

## 2022-05-04 DIAGNOSIS — F900 Attention-deficit hyperactivity disorder, predominantly inattentive type: Secondary | ICD-10-CM

## 2022-05-04 MED ORDER — LISDEXAMFETAMINE DIMESYLATE 40 MG PO CAPS: 40.0000 mg | ORAL_CAPSULE | ORAL | 0 refills | Status: DC

## 2022-05-04 NOTE — Telephone Encounter (Signed)
Pended.

## 2022-05-04 NOTE — Telephone Encounter (Signed)
Patient called in for refill on Vyvanse 40mg . Ph: [phone] Appt 1/24 Pharmacy [facility] [place]

## 2022-05-24 ENCOUNTER — Ambulatory Visit: Payer: 59 | Admitting: Psychiatry

## 2022-05-30 ENCOUNTER — Ambulatory Visit (HOSPITAL_BASED_OUTPATIENT_CLINIC_OR_DEPARTMENT_OTHER)
Admission: RE | Admit: 2022-05-30 | Discharge: 2022-05-30 | Disposition: A | Discharge status: Home / Self Care | Payer: 59 | Source: Ambulatory Visit | Attending: Family Medicine | Admitting: Family Medicine

## 2022-05-30 ENCOUNTER — Ambulatory Visit: Payer: 59 | Admitting: Family Medicine

## 2022-05-30 ENCOUNTER — Encounter: Payer: Self-pay | Admitting: Family Medicine

## 2022-05-30 VITALS — BP 122/60 | Ht 66.0 in | Wt 260.0 lb

## 2022-05-30 DIAGNOSIS — M25852 Other specified joint disorders, left hip: Secondary | ICD-10-CM | POA: Insufficient documentation

## 2022-05-30 MED ORDER — PREDNISONE 5 MG PO TABS: ORAL_TABLET | ORAL | 0 refills | Status: DC

## 2022-05-30 NOTE — Assessment & Plan Note (Signed)
Acutely occurring.  Symptoms seem more consistent with an impingement of the hip as opposed to a nerve impingement. -Counseled on home exercise therapy and supportive care  -Hip x-ray. -Prednisone. -Continue physical therapy. -Could consider injection or further imaging.

## 2022-05-30 NOTE — Patient Instructions (Signed)
Nice to meet you Please try heat  Please try the exercises  Please try the insoles We'll call with the xray results   Please send me a message in [place] with any questions or updates.  Please see me back in 4-6 weeks.   --Dr. Raeford Razor

## 2022-05-30 NOTE — Progress Notes (Signed)
  Anna Obrien - 32 y.o. female MRN [id number]  Date of birth: 1991-04-15  SUBJECTIVE:  Including CC & ROS.  No chief complaint on file.   Anna Obrien is a 32 y.o. female that is presenting with acute left hip pain.  The pain has been ongoing for over a month.  She does notice the pain in the lateral hip as well as the posterior gluteus.  Has been in physical therapy and feels that she has plateaued.  No specific injury.  Pain is intermittent in nature.  No history of surgery.    Review of Systems See HPI   HISTORY: Past Medical, Surgical, Social, and Family History Reviewed & Updated per EMR.   Pertinent Historical Findings include:  Past Medical History:  Diagnosis Date   Endometriosis    Vitamin D deficiency     Past Surgical History:  Procedure Laterality Date   APPENDECTOMY     EXCISION OF ENDOMETRIOMA       PHYSICAL EXAM:  VS: BP 122/60   Ht 5\' 6"  (1.676 m)   Wt 260 lb (117.9 kg)   BMI 41.97 kg/m  Physical Exam Gen: NAD, alert, cooperative with exam, well-appearing MSK:  Neurovascularly intact       ASSESSMENT & PLAN:   Hip impingement syndrome, left Acutely occurring.  Symptoms seem more consistent with an impingement of the hip as opposed to a nerve impingement. -Counseled on home exercise therapy and supportive care  -Hip x-ray. -Prednisone. -Continue physical therapy. -Could consider injection or further imaging.

## 2022-06-01 ENCOUNTER — Telehealth: Payer: Self-pay | Admitting: Family Medicine

## 2022-06-01 NOTE — Telephone Encounter (Signed)
Informed of results.   Rosemarie Ax, MD [facility] 06/01/2022, 8:38 AM '

## 2022-06-12 ENCOUNTER — Encounter: Payer: Self-pay | Admitting: Family Medicine

## 2022-06-14 ENCOUNTER — Other Ambulatory Visit: Payer: Self-pay | Admitting: Psychiatry

## 2022-06-14 DIAGNOSIS — F411 Generalized anxiety disorder: Secondary | ICD-10-CM

## 2022-06-20 ENCOUNTER — Ambulatory Visit (INDEPENDENT_AMBULATORY_CARE_PROVIDER_SITE_OTHER): Payer: 59 | Admitting: Psychiatry

## 2022-06-20 ENCOUNTER — Encounter: Payer: Self-pay | Admitting: Psychiatry

## 2022-06-20 DIAGNOSIS — F411 Generalized anxiety disorder: Secondary | ICD-10-CM | POA: Diagnosis not present

## 2022-06-20 DIAGNOSIS — F430 Acute stress reaction: Secondary | ICD-10-CM | POA: Diagnosis not present

## 2022-06-20 DIAGNOSIS — F339 Major depressive disorder, recurrent, unspecified: Secondary | ICD-10-CM

## 2022-06-20 DIAGNOSIS — F900 Attention-deficit hyperactivity disorder, predominantly inattentive type: Secondary | ICD-10-CM

## 2022-06-20 MED ORDER — BUSPIRONE HCL 15 MG PO TABS: 15.0000 mg | ORAL_TABLET | Freq: Every day | ORAL | 1 refills | Status: DC

## 2022-06-20 MED ORDER — CITALOPRAM HYDROBROMIDE 20 MG PO TABS: ORAL_TABLET | ORAL | 1 refills | Status: DC

## 2022-06-20 MED ORDER — BUPROPION HCL ER (XL) 300 MG PO TB24: 300.0000 mg | ORAL_TABLET | Freq: Every day | ORAL | 1 refills | Status: DC

## 2022-06-20 MED ORDER — LISDEXAMFETAMINE DIMESYLATE 40 MG PO CAPS: 40.0000 mg | ORAL_CAPSULE | ORAL | 0 refills | Status: DC

## 2022-06-20 NOTE — Progress Notes (Unsigned)
Anna Obrien [id number] 09-Sep-1990 32 y.o.  Subjective:   [name] is a 32 y.o. (DOB [birth year]) female.  Chief Complaint:  Chief Complaint  Patient presents with   Follow-up    depression, anxiety, ADHD, and binge eating.      HPI Anna Obrien presents to the office today for follow-up of depression, anxiety, ADHD, and binge eating. She reports, "overall ok." Denies depressed mood. She reports that there was a brief period where she "got off track" with taking meds consistently with traveling and work stress. She has been using a medication box to help with taking medications regularly. She was without Vyvanse for awhile and then re-started that recently. She feels that Vyvanse is helpful for her concentration and ability to complete tasks. She reports that Vyvanse also is helpful for binge eating and emotional eating. Anxiety has been "pretty good." Rare exaggerated startle response. Some anxiety about a family member. Denies panic. Reports once incidence where she had a flashback and "was able to work through it." Sleep has been ok until recently sciatica. Denies nightmares. Energy and motivation have been ok overall. Denies SI.   Work has been going well. Relationship has been going well. She continues to go back and forth between boyfriend's home in [place].   Continues to see therapist regularly. Sees Kizzie Ide, [facility] for therapy at [place].   Past Psychiatric Medication Trials: Celexa- starting in early 2019 on 20 mg qd. Improved sleep and PMDD s/s. Unsure how helpful it has been for depression and anxiety in general. Wellbutrin XL Trazodone- Excessive somnolence Melatonin- Nightmares OTC sleep aids- ineffective Propranolol- helpful Buspar- helpful Dayvigo- helpful  GAD-7    Flowsheet Row Office Visit from 11/14/2019 in [facility]  Total GAD-7 Score 8        Review of Systems:  Review of Systems   Musculoskeletal:  Negative for gait problem.  Neurological:        Recent sciatica  Psychiatric/Behavioral:         Please refer to HPI  She is working with sports medicine, chiropractor, and PT to help with sciatica.   Medications: I have reviewed the patient's current medications.  Current Outpatient Medications  Medication Sig Dispense Refill   cholecalciferol (VITAMIN D3) 25 MCG (1000 UNIT) tablet Take 2,000 Units by mouth daily.      etonogestrel (NEXPLANON) 68 MG IMPL implant 1 each by Subdermal route once.     Multiple Vitamins-Minerals (WHOLE FOOD MULTIVITAMIN PO) Take by mouth.     spironolactone (ALDACTONE) 50 MG tablet Take 50 mg by mouth daily.     UNABLE TO FIND Med Name: "Calm" pills as needed (Magnesium, Ashwaghanda, Melatonin, L-threonate)     buPROPion (WELLBUTRIN XL) 300 MG 24 hr tablet Take 1 tablet (300 mg total) by mouth daily. 90 tablet 1   busPIRone (BUSPAR) 15 MG tablet Take 1 tablet (15 mg total) by mouth daily. 90 tablet 1   citalopram (CELEXA) 20 MG tablet TAKE 1 AND 1/2 TABLETS DAILY BY MOUTH 135 tablet 1   HYDROcodone-acetaminophen (NORCO/VICODIN) 5-325 MG tablet Take 1 tablet by mouth every 8 (eight) hours as needed. 15 tablet 0   lisdexamfetamine (VYVANSE) 40 MG capsule Take 1 capsule (40 mg total) by mouth every morning. 30 capsule 0   [START ON 07/18/2022] lisdexamfetamine (VYVANSE) 40 MG capsule Take 1 capsule (40 mg total) by mouth every morning. 30 capsule 0   [START ON 08/15/2022] lisdexamfetamine (VYVANSE)  40 MG capsule Take 1 capsule (40 mg total) by mouth every morning. 30 capsule 0   MAGNESIUM CITRATE PO Take by mouth at bedtime as needed. (Patient not taking: Reported on 10/28/2021)     predniSONE (DELTASONE) 5 MG tablet Take 6 pills for first day, 5 pills second day, 4 pills third day, 3 pills fourth day, 2 pills the fifth day, and 1 pill sixth day. (Patient not taking: Reported on 06/20/2022) 21 tablet 0   No current facility-administered  medications for this visit.    Medication Side Effects: None  Allergies: No Known Allergies  Past Medical History:  Diagnosis Date   Endometriosis    Vitamin D deficiency     Past Medical History, Surgical history, Social history, and Family history were reviewed and updated as appropriate.   Please see review of systems for further details on the patient's review from today.   Objective:   Physical Exam:  BP 114/66   Pulse 81   Physical Exam Constitutional:      General: She is not in acute distress. Musculoskeletal:        General: No deformity.  Neurological:     Mental Status: She is alert and oriented to person, place, and time.     Coordination: Coordination normal.  Psychiatric:        Attention and Perception: Attention and perception normal. She does not perceive auditory or visual hallucinations.        Mood and Affect: Mood normal. Mood is not anxious or depressed. Affect is not labile, blunt, angry or inappropriate.        Speech: Speech normal.        Behavior: Behavior normal.        Thought Content: Thought content normal. Thought content is not paranoid or delusional. Thought content does not include homicidal or suicidal ideation. Thought content does not include homicidal or suicidal plan.        Cognition and Memory: Cognition and memory normal.        Judgment: Judgment normal.     Comments: Insight intact     Lab Review:  No results found for: "NA", "K", "CL", "CO2", "GLUCOSE", "BUN", "CREATININE", "CALCIUM", "PROT", "ALBUMIN", "AST", "ALT", "ALKPHOS", "BILITOT", "GFRNONAA", "GFRAA"  No results found for: "WBC", "RBC", "HGB", "HCT", "PLT", "MCV", "MCH", "MCHC", "RDW", "LYMPHSABS", "MONOABS", "EOSABS", "BASOSABS"  No results found for: "POCLITH", "LITHIUM"   No results found for: "PHENYTOIN", "PHENOBARB", "VALPROATE", "CBMZ"   .res Assessment: Plan:    Will continue current plan of care since target signs and symptoms are well controlled  without any tolerability issues. Continue Vyvanse 40 mg po qd for ADHD. Continue Wellbutrin XL 300 mg po qd for depression.  Continue Buspar 15 mg po qd for anxiety.  Continue Celexa 30 mg po qd for depression and anxiety.  Recommend continuing psychotherapy.  Pt to follow-up in 6 months or sooner if clinically indicated.  Requested pt call in 3 months to provide update and request additional scripts.  Patient advised to contact office with any questions, adverse effects, or acute worsening in signs and symptoms.   Anna Obrien was seen today for follow-up.  Diagnoses and all orders for this visit:  Attention deficit hyperactivity disorder (ADHD), predominantly inattentive type -     lisdexamfetamine (VYVANSE) 40 MG capsule; Take 1 capsule (40 mg total) by mouth every morning. -     lisdexamfetamine (VYVANSE) 40 MG capsule; Take 1 capsule (40 mg total) by mouth every morning. -  lisdexamfetamine (VYVANSE) 40 MG capsule; Take 1 capsule (40 mg total) by mouth every morning.  Recurrent major depressive disorder, remission status unspecified (HCC) -     buPROPion (WELLBUTRIN XL) 300 MG 24 hr tablet; Take 1 tablet (300 mg total) by mouth daily.  Generalized anxiety disorder -     busPIRone (BUSPAR) 15 MG tablet; Take 1 tablet (15 mg total) by mouth daily. -     citalopram (CELEXA) 20 MG tablet; TAKE 1 AND 1/2 TABLETS DAILY BY MOUTH  Acute stress reaction -     busPIRone (BUSPAR) 15 MG tablet; Take 1 tablet (15 mg total) by mouth daily.     Please see After Visit Summary for patient specific instructions.  Future Appointments  Date Time Provider [place]  07/19/2022  4:10 PM Rosemarie Ax, MD [facility]  12/19/2022 12:45 PM Thayer Headings, PMHNP [facility] None    No orders of the defined types were placed in this encounter.   -------------------------------

## 2022-06-21 ENCOUNTER — Ambulatory Visit (INDEPENDENT_AMBULATORY_CARE_PROVIDER_SITE_OTHER): Payer: 59 | Admitting: Family Medicine

## 2022-06-21 ENCOUNTER — Ambulatory Visit (HOSPITAL_BASED_OUTPATIENT_CLINIC_OR_DEPARTMENT_OTHER)
Admission: RE | Admit: 2022-06-21 | Discharge: 2022-06-21 | Disposition: A | Discharge status: Home / Self Care | Payer: 59 | Source: Ambulatory Visit | Attending: Family Medicine | Admitting: Family Medicine

## 2022-06-21 ENCOUNTER — Encounter: Payer: Self-pay | Admitting: Family Medicine

## 2022-06-21 ENCOUNTER — Ambulatory Visit: Payer: Self-pay

## 2022-06-21 VITALS — BP 128/78 | Ht 66.0 in | Wt 260.0 lb

## 2022-06-21 DIAGNOSIS — M25852 Other specified joint disorders, left hip: Secondary | ICD-10-CM

## 2022-06-21 DIAGNOSIS — M5416 Radiculopathy, lumbar region: Secondary | ICD-10-CM | POA: Insufficient documentation

## 2022-06-21 MED ORDER — METHYLPREDNISOLONE ACETATE 40 MG/ML IJ SUSP
40.0000 mg | Freq: Once | INTRAMUSCULAR | Status: AC
  Administered 2022-06-21: 40 mg via INTRAMUSCULAR

## 2022-06-21 MED ORDER — HYDROCODONE-ACETAMINOPHEN 5-325 MG PO TABS: 1.0000 | ORAL_TABLET | Freq: Three times a day (TID) | ORAL | 0 refills | Status: DC | PRN

## 2022-06-21 NOTE — Progress Notes (Addendum)
  Anna Obrien - 32 y.o. female MRN [id number]  Date of birth: [birth year]  SUBJECTIVE:  Including CC & ROS.  No chief complaint on file.   Anna Obrien is a 32 y.o. female that is presenting with worsening of her left hip and leg pain.  The pain has been ongoing for several months and has gotten worse acutely.  She appreciates the pain in the posterior gluteus as well as rating down her left leg.  Having limitations in her range of motion and ability to function.   Review of Systems See HPI   HISTORY: Past Medical, Surgical, Social, and Family History Reviewed & Updated per EMR.   Pertinent Historical Findings include:  Past Medical History:  Diagnosis Date   Endometriosis    Vitamin D deficiency     Past Surgical History:  Procedure Laterality Date   APPENDECTOMY     EXCISION OF ENDOMETRIOMA       PHYSICAL EXAM:  VS: BP 128/78   Ht 5' 6"$  (1.676 m)   Wt 260 lb (117.9 kg)   BMI 41.97 kg/m  Physical Exam Gen: NAD, alert, cooperative with exam, well-appearing MSK:  Back/left leg:  Limited flexion extension of the leg. Positive straight leg raise. Weakness on resistance to exam. Diminished deep tendon reflexes at the patella. Neurovascularly intact     Aspiration/Injection Procedure Note Anna Obrien [birth year]  Procedure: Injection Indications: left posterior hip pain  Procedure Details Consent: Risks of procedure as well as the alternatives and risks of each were explained to the (patient/caregiver).  Consent for procedure obtained. Time Out: Verified patient identification, verified procedure, site/side was marked, verified correct patient position, special equipment/implants available, medications/allergies/relevent history reviewed, required imaging and test results available.  Performed.  The area was cleaned with iodine and alcohol swabs.    The left ischium was injected using 4 cc of 1% lidocaine and .4 cc's 8.4% sodium bicarbonate on a 22-gauge 3-1/2 inch  needle.  The syringe was switched to mixture containing 1 cc's of 40 mg Depo-Medrol and 4 cc's of 0.25% bupivacaine was injected.  Ultrasound was used. Images were obtained in long views showing the injection.     A sterile dressing was applied.  Patient did tolerate procedure well.     ASSESSMENT & PLAN:   Hip impingement syndrome, left Acute on chronic in nature.  Having pain in the posterior compartment. -Counseled on home exercise therapy and supportive care. - injection today  - could consider further imaging.    Lumbar radiculopathy Acute on chronic in nature.  Does have pain on the posterior aspect of the left leg with worsening pain.  Does appear to be more radicular in nature.  She has participated in physical therapy for over 6 months with limited improvement. - counseled on home exercise therapy and supportive care. -Xray  - norco  - MRI of the lumbar spine to evaluate for nerve impingement and the consideration of epidural

## 2022-06-21 NOTE — Patient Instructions (Signed)
Good to see you  Please try heat  Please use the pain medicine as needed  We'll call with the results from today  Please send me a message in MyChart with any questions or updates.  Please see me back 4 weeks.   --Dr. Raeford Razor

## 2022-06-21 NOTE — Assessment & Plan Note (Signed)
Acute on chronic in nature.  Having pain in the posterior compartment. -Counseled on home exercise therapy and supportive care. - injection today  - could consider further imaging.

## 2022-06-21 NOTE — Assessment & Plan Note (Addendum)
Acute on chronic in nature.  Does have pain on the posterior aspect of the left leg with worsening pain.  Does appear to be more radicular in nature.  She has participated in physical therapy for over 6 months with limited improvement. - counseled on home exercise therapy and supportive care. -Xray  - norco  - MRI of the lumbar spine to evaluate for nerve impingement and the consideration of epidural

## 2022-06-22 ENCOUNTER — Encounter: Payer: Self-pay | Admitting: Family Medicine

## 2022-06-23 ENCOUNTER — Telehealth: Payer: Self-pay | Admitting: Family Medicine

## 2022-06-23 NOTE — Addendum Note (Signed)
Addended by: Rosemarie Ax on: 06/23/2022 12:38 PM   Modules accepted: Orders

## 2022-06-23 NOTE — Telephone Encounter (Signed)
Left VM for patient. If she calls back please have her speak with a nurse/CMA and inform that her xrays are normal.   If any questions then please take the best time and phone number to call and I will try to call her back.   Rosemarie Ax, MD [facility] 06/23/2022, 9:23 AM

## 2022-06-28 ENCOUNTER — Other Ambulatory Visit: Payer: Self-pay | Admitting: Internal Medicine

## 2022-06-28 DIAGNOSIS — M5416 Radiculopathy, lumbar region: Secondary | ICD-10-CM

## 2022-07-01 ENCOUNTER — Ambulatory Visit
Admission: RE | Admit: 2022-07-01 | Discharge: 2022-07-01 | Disposition: A | Discharge status: Home / Self Care | Payer: 59 | Source: Ambulatory Visit | Attending: Internal Medicine | Admitting: Internal Medicine

## 2022-07-01 DIAGNOSIS — M5416 Radiculopathy, lumbar region: Secondary | ICD-10-CM

## 2022-07-04 ENCOUNTER — Ambulatory Visit: Payer: 59 | Admitting: Family Medicine

## 2022-07-11 ENCOUNTER — Encounter: Payer: Self-pay | Admitting: Family Medicine

## 2022-07-11 ENCOUNTER — Telehealth (INDEPENDENT_AMBULATORY_CARE_PROVIDER_SITE_OTHER): Payer: 59 | Admitting: Family Medicine

## 2022-07-11 VITALS — Ht 66.0 in | Wt 260.0 lb

## 2022-07-11 DIAGNOSIS — M5416 Radiculopathy, lumbar region: Secondary | ICD-10-CM | POA: Diagnosis not present

## 2022-07-11 NOTE — Assessment & Plan Note (Signed)
Continues to have severe pain and MRI was confirming a herniation. -Counseled on home exercise therapy and supportive care. -Completed paperwork. -She has microdiscectomy scheduled with neurosurgery.

## 2022-07-11 NOTE — Progress Notes (Signed)
Virtual Visit via Video Note  I connected with Epifania Gore on 07/11/22 at  3:10 PM EDT by a video enabled telemedicine application and verified that I am speaking with the correct person using two identifiers.  Location: Patient: home Provider: office   I discussed the limitations of evaluation and management by telemedicine and the availability of in person appointments. The patient expressed understanding and agreed to proceed.  History of Present Illness:  Ms. Pross is following up for left-sided lumbar radiculopathy.  She continues to have severe pain and was seen by a neurosurgeon.  She has not been able to work onsite.  She continues to be able to work intermittently at home.  Observations/Objective:   Assessment and Plan:  Lumbar radiculopathy: Continues to have severe pain and MRI was confirming a herniation. -Counseled on home exercise therapy and supportive care. -Completed paperwork. -She has microdiscectomy scheduled with neurosurgery.  Follow Up Instructions:    I discussed the assessment and treatment plan with the patient. The patient was provided an opportunity to ask questions and all were answered. The patient agreed with the plan and demonstrated an understanding of the instructions.   The patient was advised to call back or seek an in-person evaluation if the symptoms worsen or if the condition fails to improve as anticipated.    Clearance Coots, MD

## 2022-07-19 ENCOUNTER — Ambulatory Visit: Payer: 59 | Admitting: Family Medicine

## 2022-08-14 ENCOUNTER — Encounter: Payer: Self-pay | Admitting: *Deleted

## 2022-09-06 MED ORDER — LISDEXAMFETAMINE DIMESYLATE 50 MG PO CAPS: 50.0000 mg | ORAL_CAPSULE | Freq: Every day | ORAL | 0 refills | Status: DC

## 2022-09-07 ENCOUNTER — Encounter: Payer: Self-pay | Admitting: Psychiatry

## 2022-09-07 DIAGNOSIS — F900 Attention-deficit hyperactivity disorder, predominantly inattentive type: Secondary | ICD-10-CM

## 2022-10-10 MED ORDER — LISDEXAMFETAMINE DIMESYLATE 50 MG PO CAPS: 50.0000 mg | ORAL_CAPSULE | Freq: Every day | ORAL | 0 refills | Status: DC

## 2022-12-11 IMAGING — CR DG KNEE AP/LAT W/ SUNRISE*L*
4 series · 4 of 4 positions shown · non-contrast
Comparison: None.

CLINICAL DATA: Status post fall.

EXAM:
LEFT KNEE 3 VIEWS

[w knee ap left]
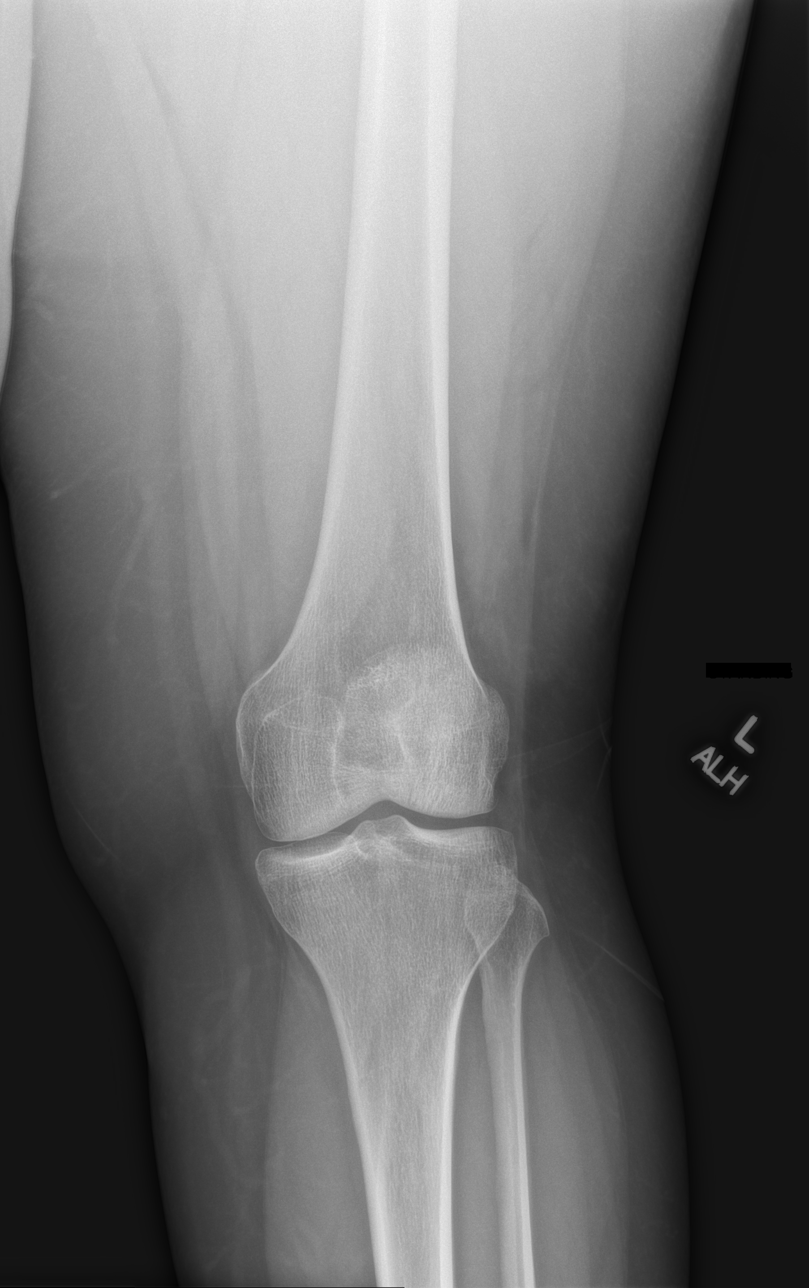

[w knee lat left (1 of 2)]
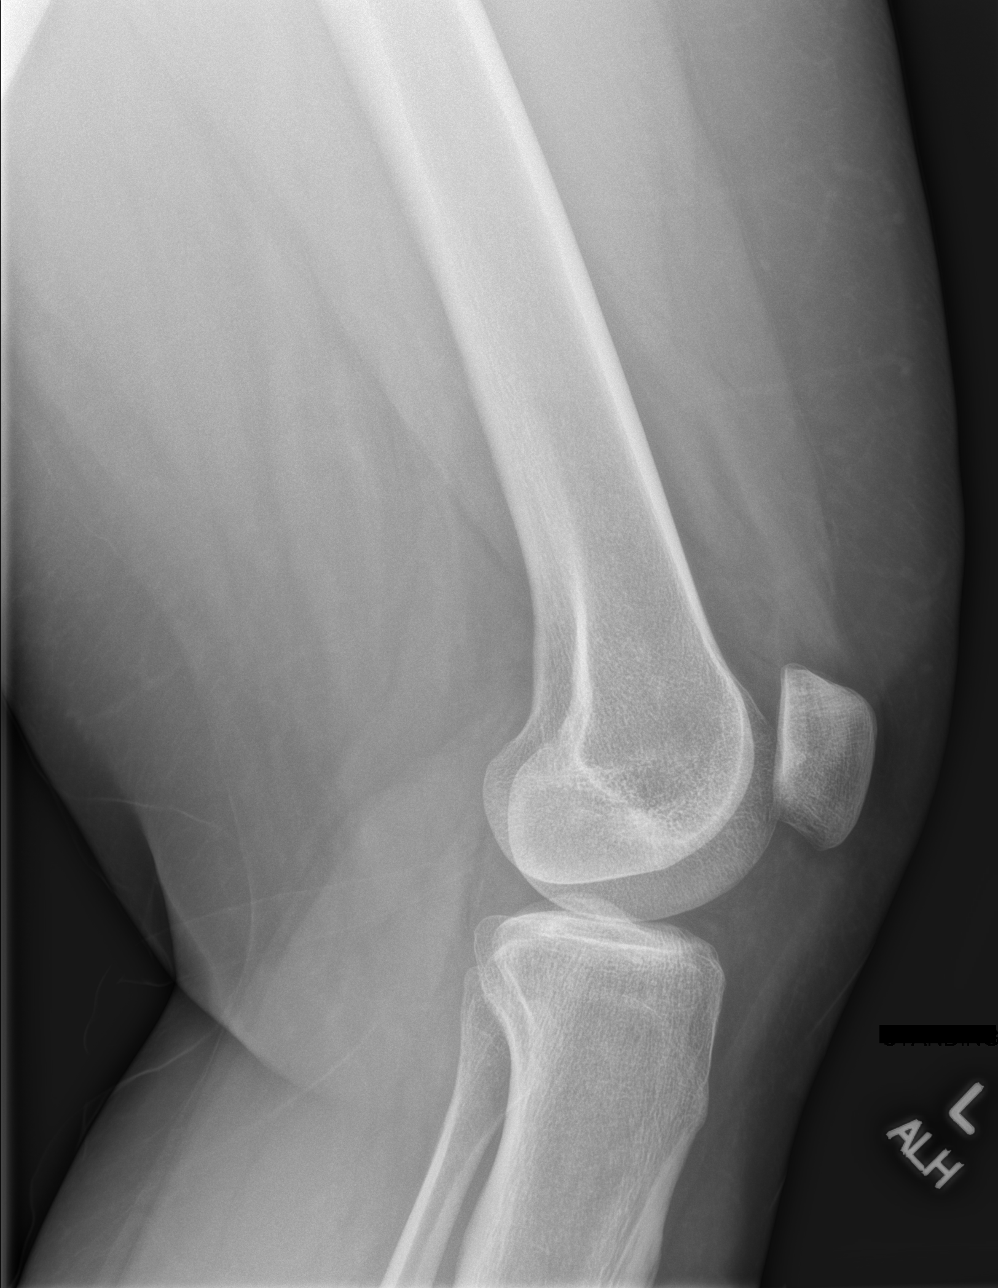

[w knee lat left (2 of 2)]
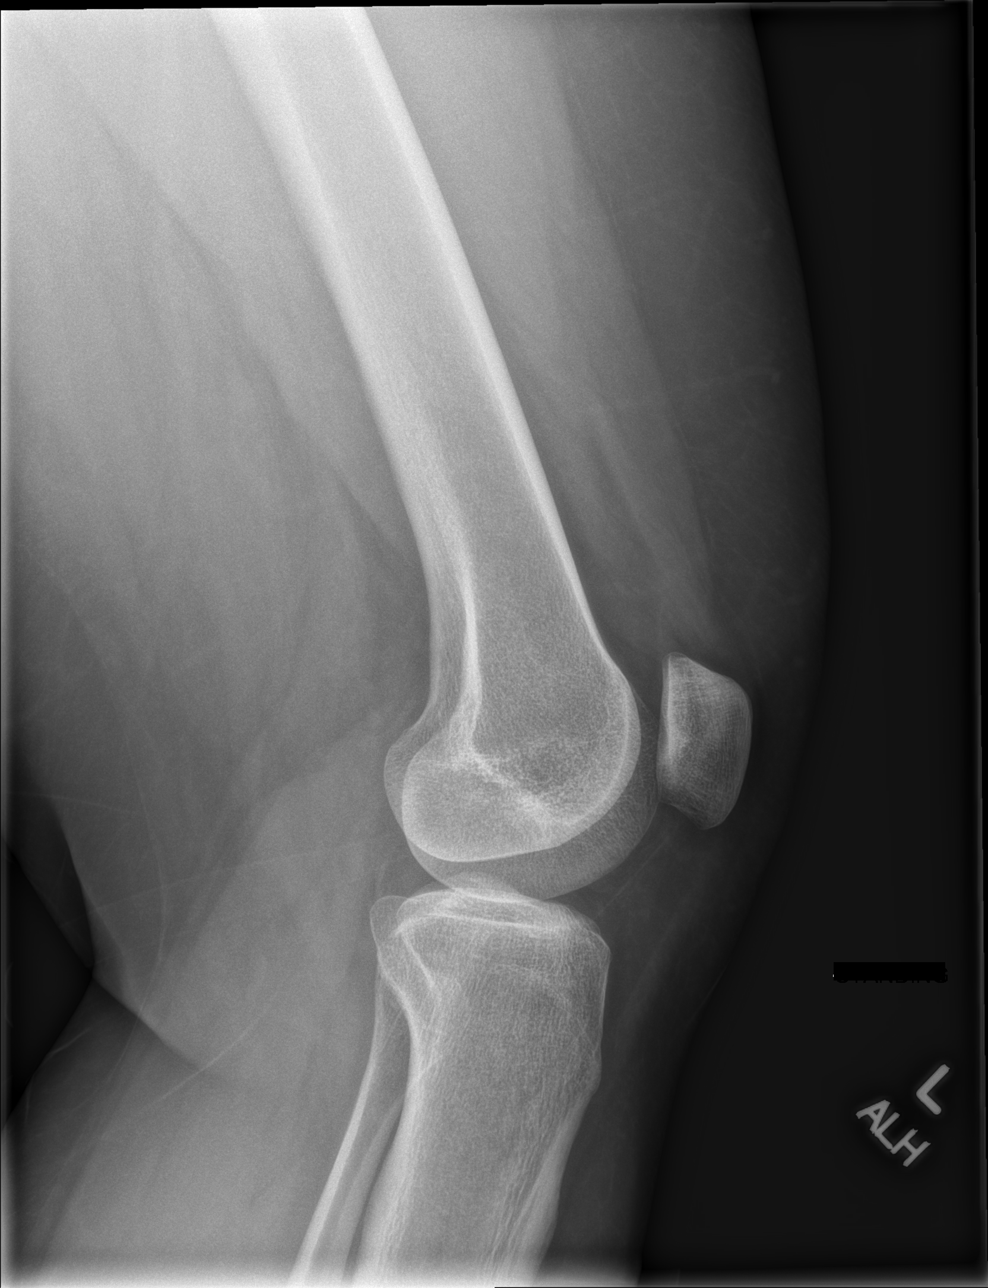

[x knee sunrise left]
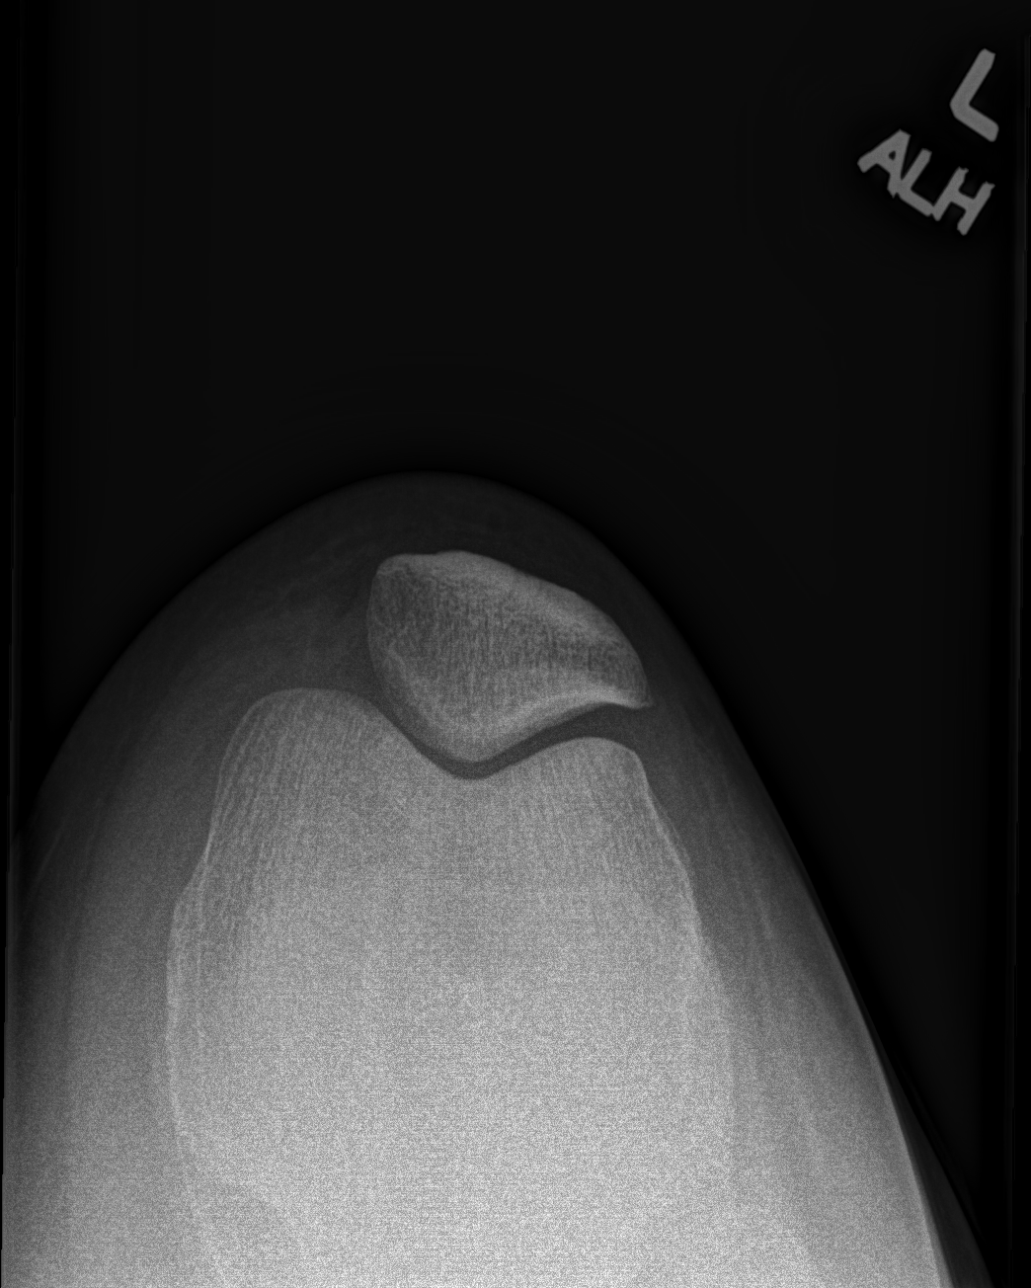

[4 of 4 positions shown; findings below may reference images not displayed]

FINDINGS: No evidence of fracture, dislocation, or joint effusion. No evidence
of arthropathy or other focal bone abnormality. Mild anterior soft
tissue swelling is noted.
IMPRESSION: Mild anterior soft tissue swelling, without an acute osseous
abnormality.

## 2022-12-19 ENCOUNTER — Ambulatory Visit (INDEPENDENT_AMBULATORY_CARE_PROVIDER_SITE_OTHER): Payer: 59 | Admitting: Psychiatry

## 2022-12-19 ENCOUNTER — Encounter: Payer: Self-pay | Admitting: Psychiatry

## 2022-12-19 DIAGNOSIS — F900 Attention-deficit hyperactivity disorder, predominantly inattentive type: Secondary | ICD-10-CM | POA: Diagnosis not present

## 2022-12-19 DIAGNOSIS — F339 Major depressive disorder, recurrent, unspecified: Secondary | ICD-10-CM

## 2022-12-19 DIAGNOSIS — F411 Generalized anxiety disorder: Secondary | ICD-10-CM

## 2022-12-19 DIAGNOSIS — F430 Acute stress reaction: Secondary | ICD-10-CM | POA: Diagnosis not present

## 2022-12-19 MED ORDER — LISDEXAMFETAMINE DIMESYLATE 50 MG PO CAPS
50.0000 mg | ORAL_CAPSULE | Freq: Every day | ORAL | 0 refills | Status: DC
Start: 2023-03-13 — End: 2024-03-11

## 2022-12-19 MED ORDER — BUSPIRONE HCL 15 MG PO TABS
15.0000 mg | ORAL_TABLET | Freq: Every day | ORAL | 1 refills | Status: AC
Start: 2022-12-19 — End: 2023-06-17

## 2022-12-19 MED ORDER — CITALOPRAM HYDROBROMIDE 20 MG PO TABS
ORAL_TABLET | ORAL | 1 refills | Status: DC
Start: 2022-12-19 — End: 2023-11-09

## 2022-12-19 MED ORDER — BUPROPION HCL ER (XL) 300 MG PO TB24
300.0000 mg | ORAL_TABLET | Freq: Every day | ORAL | 1 refills | Status: DC
Start: 2022-12-19 — End: 2023-11-09

## 2022-12-19 NOTE — Progress Notes (Signed)
Anna Obrien [id number] 11-28-1990 32 y.o.  Virtual Visit via Telephone Note  I connected with pt on 12/19/22 at 12:45 PM EDT by telephone and verified that I am speaking with the correct person using two identifiers.   I discussed the limitations, risks, security and privacy concerns of performing an evaluation and management service by telephone and the availability of in person appointments. I also discussed with the patient that there may be a patient responsible charge related to this service. The patient expressed understanding and agreed to proceed.   I discussed the assessment and treatment plan with the patient. The patient was provided an opportunity to ask questions and all were answered. The patient agreed with the plan and demonstrated an understanding of the instructions.   The patient was advised to call back or seek an in-person evaluation if the symptoms worsen or if the condition fails to improve as anticipated.  I provided 28 minutes of non-face-to-face time during this encounter.  The patient was located in her personal vehicle.  The provider was located at home.   Corie Chiquito, PMHNP   Subjective:   [name] is a 32 y.o. (DOB 30-Dec-1990) female.  Chief Complaint:  Chief Complaint  Patient presents with   Follow-up    Anxiety, depression, and ADHD    HPI Anna Obrien presents for follow-up of depression, anxiety, and ADHD. She reports that she had back surgery and "feeling a lot better now." She has been meal planning and going to workout 2-3 times a week. She denies any recent depression. Some sadness around the anniversary of her father's death. She reports that her anxiety has "been good overall, a few stressful situations at work but I think I managed it well." Denies panic. She reports some rare intrusive memories and re-experiencing that do not interfere with function. Sleep has been ok. Denies nightmares. She reports adequate concentration  focus. She reports a few occasions where she has hyper-focused on things. She reports improved food cravings and binge eating. She reports that she has been paying someone to prepare some meals and this has been helpful. Energy and motivation has been "pretty good." Denies anhedonia. She reports that she is enjoying things and looking forward to the future. Denies SI.   She reports, "I'm feeling like I am getting back to me."   She had back surgery in March.   She reports that work has been going well.   Has a new rescue dog. She reports that her relationship is going really well and they have been together over 2 years.   Continues to see therapist about every 5-6 weeks.  Vvyanse last filled 10/10/22.  Review of Systems:  Review of Systems  Musculoskeletal:  Negative for back pain and gait problem.  Psychiatric/Behavioral:         Please refer to HPI    Medications: I have reviewed the patient's current medications.  Current Outpatient Medications  Medication Sig Dispense Refill   cholecalciferol (VITAMIN D3) 25 MCG (1000 UNIT) tablet Take 2,000 Units by mouth daily.      etonogestrel (NEXPLANON) 68 MG IMPL implant 1 each by Subdermal route once.     MAGNESIUM CITRATE PO Take by mouth at bedtime as needed.     Multiple Vitamins-Minerals (WHOLE FOOD MULTIVITAMIN PO) Take by mouth.     spironolactone (ALDACTONE) 50 MG tablet Take 50 mg by mouth daily.     UNABLE TO FIND Med Name: "Calm" pills as needed (Magnesium,  Ashwaghanda, Melatonin, L-threonate)     buPROPion (WELLBUTRIN XL) 300 MG 24 hr tablet Take 1 tablet (300 mg total) by mouth daily. 90 tablet 1   busPIRone (BUSPAR) 15 MG tablet Take 1 tablet (15 mg total) by mouth daily. 90 tablet 1   citalopram (CELEXA) 20 MG tablet TAKE 1 AND 1/2 TABLETS DAILY BY MOUTH 135 tablet 1   lisdexamfetamine (VYVANSE) 50 MG capsule Take 1 capsule (50 mg total) by mouth daily. 30 capsule 0   lisdexamfetamine (VYVANSE) 50 MG capsule Take 1 capsule  (50 mg total) by mouth daily. 30 capsule 0   [START ON 03/13/2023] lisdexamfetamine (VYVANSE) 50 MG capsule Take 1 capsule (50 mg total) by mouth daily. 30 capsule 0   No current facility-administered medications for this visit.    Medication Side Effects: None  Allergies: No Known Allergies  Past Medical History:  Diagnosis Date   Endometriosis    Vitamin D deficiency     Family History  Problem Relation Age of Onset   Depression Mother    Sleep apnea Father    Diabetes Father    Heart attack Father    Anxiety disorder Sister    Depression Sister    Sleep apnea Sister     Social History   Socioeconomic History   Marital status: Single    Spouse name: Not on file   Number of children: Not on file   Years of education: Not on file   Highest education level: Not on file  Occupational History   Not on file  Tobacco Use   Smoking status: Never   Smokeless tobacco: Never  Substance and Sexual Activity   Alcohol use: Yes    Comment: occ   Drug use: No   Sexual activity: Not on file  Other Topics Concern   Not on file  Social History Narrative   Not on file   Social Determinants of Health   Financial Resource Strain: Not on file  Food Insecurity: Not on file  Transportation Needs: Not on file  Physical Activity: Not on file  Stress: Not on file  Social Connections: Not on file  Intimate Partner Violence: Not on file    Past Medical History, Surgical history, Social history, and Family history were reviewed and updated as appropriate.   Please see review of systems for further details on the patient's review from today.   Objective:   Physical Exam:  Pulse 78   Physical Exam Neurological:     Mental Status: She is alert and oriented to person, place, and time.     Cranial Nerves: No dysarthria.  Psychiatric:        Attention and Perception: Attention and perception normal.        Mood and Affect: Mood normal.        Speech: Speech normal.         Behavior: Behavior is cooperative.        Thought Content: Thought content normal. Thought content is not paranoid or delusional. Thought content does not include homicidal or suicidal ideation. Thought content does not include homicidal or suicidal plan.        Cognition and Memory: Cognition and memory normal.        Judgment: Judgment normal.     Comments: Insight intact     Lab Review:  No results found for: "NA", "K", "CL", "CO2", "GLUCOSE", "BUN", "CREATININE", "CALCIUM", "PROT", "ALBUMIN", "AST", "ALT", "ALKPHOS", "BILITOT", "GFRNONAA", "GFRAA"  No results found for: "WBC", "  RBC", "HGB", "HCT", "PLT", "MCV", "MCH", "MCHC", "RDW", "LYMPHSABS", "MONOABS", "EOSABS", "BASOSABS"  No results found for: "POCLITH", "LITHIUM"   No results found for: "PHENYTOIN", "PHENOBARB", "VALPROATE", "CBMZ"   .res Assessment: Plan:    Will continue current plan of care since target signs and symptoms are well controlled without any tolerability issues. Continue Wellbutrin XL 300 mg daily for depression.  Continue Celexa 30 mg daily for anxiety and depression.  Continue Vyvanse 50 mg daily for ADHD.  Continue Buspar 15 mg daily for anxiety.  Pt to follow-up in 6 months or sooner if clinically indicated.  Requested pt call in 3 months to provide update and request additional scripts.  Patient advised to contact office with any questions, adverse effects, or acute worsening in signs and symptoms.   Dessence was seen today for follow-up.  Diagnoses and all orders for this visit:  Attention deficit hyperactivity disorder (ADHD), predominantly inattentive type -     lisdexamfetamine (VYVANSE) 50 MG capsule; Take 1 capsule (50 mg total) by mouth daily.  Recurrent major depressive disorder, remission status unspecified (HCC) -     buPROPion (WELLBUTRIN XL) 300 MG 24 hr tablet; Take 1 tablet (300 mg total) by mouth daily.  Generalized anxiety disorder -     busPIRone (BUSPAR) 15 MG tablet; Take 1  tablet (15 mg total) by mouth daily. -     citalopram (CELEXA) 20 MG tablet; TAKE 1 AND 1/2 TABLETS DAILY BY MOUTH  Acute stress reaction -     busPIRone (BUSPAR) 15 MG tablet; Take 1 tablet (15 mg total) by mouth daily.    Please see After Visit Summary for patient specific instructions.  No future appointments.  No orders of the defined types were placed in this encounter.     -------------------------------

## 2023-03-14 ENCOUNTER — Encounter: Payer: Self-pay | Admitting: Psychiatry

## 2023-05-09 ENCOUNTER — Telehealth: Payer: Self-pay

## 2023-05-09 NOTE — Telephone Encounter (Signed)
 Prior Authorization Lisdexamfetamine Dimesylate 50MG  capsules #30/30 [employer]  Approved Effective:  05/09/2023 to 05/08/2026

## 2023-11-09 ENCOUNTER — Encounter: Payer: Self-pay | Admitting: Adult Health

## 2023-11-09 ENCOUNTER — Telehealth: Admitting: Adult Health

## 2023-11-09 DIAGNOSIS — F909 Attention-deficit hyperactivity disorder, unspecified type: Secondary | ICD-10-CM

## 2023-11-09 DIAGNOSIS — F411 Generalized anxiety disorder: Secondary | ICD-10-CM

## 2023-11-09 DIAGNOSIS — F32A Depression, unspecified: Secondary | ICD-10-CM

## 2023-11-09 DIAGNOSIS — F900 Attention-deficit hyperactivity disorder, predominantly inattentive type: Secondary | ICD-10-CM

## 2023-11-09 DIAGNOSIS — F339 Major depressive disorder, recurrent, unspecified: Secondary | ICD-10-CM

## 2023-11-09 DIAGNOSIS — F419 Anxiety disorder, unspecified: Secondary | ICD-10-CM

## 2023-11-09 MED ORDER — BUPROPION HCL ER (XL) 150 MG PO TB24
150.0000 mg | ORAL_TABLET | Freq: Every day | ORAL | 0 refills | Status: DC
Start: 2023-11-09 — End: 2024-03-11

## 2023-11-09 MED ORDER — BUPROPION HCL ER (XL) 300 MG PO TB24
300.0000 mg | ORAL_TABLET | Freq: Every day | ORAL | 1 refills | Status: AC
Start: 2023-11-09 — End: ?

## 2023-11-09 MED ORDER — LISDEXAMFETAMINE DIMESYLATE 30 MG PO CAPS: 30.0000 mg | ORAL_CAPSULE | Freq: Every day | ORAL | 0 refills | Status: DC

## 2023-11-09 MED ORDER — CITALOPRAM HYDROBROMIDE 40 MG PO TABS
ORAL_TABLET | ORAL | 0 refills | Status: DC
Start: 2023-11-09 — End: 2024-03-09

## 2023-11-09 NOTE — Progress Notes (Signed)
 Anna Obrien [id number] [birth year] 33 y.o.  Virtual Visit via Video Note  I connected with pt @ on 11/09/23 at 11:00 AM EDT by a video enabled telemedicine application and verified that I am speaking with the correct person using two identifiers.   I discussed the limitations of evaluation and management by telemedicine and the availability of in person appointments. The patient expressed understanding and agreed to proceed.  I discussed the assessment and treatment plan with the patient. The patient was provided an opportunity to ask questions and all were answered. The patient agreed with the plan and demonstrated an understanding of the instructions.   The patient was advised to call back or seek an in-person evaluation if the symptoms worsen or if the condition fails to improve as anticipated.  I provided 40 minutes of non-face-to-face time during this encounter.  The patient was located at home.  The provider was located at [facility].   Angeline LOISE Sayers, NP   Subjective:   [name] is a 33 y.o. (DOB 04-Jul-1990) female.  Chief Complaint: No chief complaint on file.   HPI Anna Obrien presents for follow-up of depression, anxiety, and ADHD.   Describes mood today as ok, not horrible - not great. Pleasant. Reports tearfulness. Mood symptoms - denies depression with taking Celexa  at 40mg  daily. Reports stable interest, but lacks motivation. Reports her house is a mess - difficulties getting herself to do things. Denies irritability. Reports anxiety several times a week - mostly about work. Concerned about not doing a good job - concerned about getting fired. Reports she worries about things she shouldn't be worried about. Reports worry rumination and over thinking. Reports obsessive thoughts and acts. Reports being very particular - likes things in a certain way. Reports she is taking the medications she has as prescribed, but is out of other medications.  Taking medications as prescribed.  Energy levels lower - feels tired all the time - I don't feel ike doing anything. Reports she plans to go to the gym, but struggles to get there. Active, does not have a regular exercise routine.  Enjoys some usual interests and activities. Lives alone - she and boyfriend staying together - he lives in [place]. Spending time with family and friends. Appetite adequate. Weight loss - 10 to 15 pounds - stalling now. Taking a semaglutide. Sleeps well most nights. Averages 7 to 8 hours. Focus and concentration stable. Completing tasks. Managing aspects of household. Working full time - [employer]. Denies SI or HI.  Denies AH or VH. Denies self harm. Denies substance use.  Working with a therapist - Manuelita Keas.  Past Psychiatric Medication Trials: Celexa - starting in early 2019 on 20 mg qd. Improved sleep and PMDD s/s. Unsure how helpful it has been for depression and anxiety in general. Trazodone- Excessive somnolence Melatonin- Nightmares OTC sleep aids- ineffective   Review of Systems:  Review of Systems  Musculoskeletal:  Negative for gait problem.  Neurological:  Negative for tremors.  Psychiatric/Behavioral:         Please refer to HPI    Medications: I have reviewed the patient's current medications.  Current Outpatient Medications  Medication Sig Dispense Refill   buPROPion  (WELLBUTRIN  XL) 300 MG 24 hr tablet Take 1 tablet (300 mg total) by mouth daily. 90 tablet 1   busPIRone  (BUSPAR ) 15 MG tablet Take 1 tablet (15 mg total) by mouth daily. 90 tablet 1   cholecalciferol (VITAMIN D3) 25 MCG (1000 UNIT) tablet Take  2,000 Units by mouth daily.      citalopram  (CELEXA ) 20 MG tablet TAKE 1 AND 1/2 TABLETS DAILY BY MOUTH 135 tablet 1   etonogestrel (NEXPLANON) 68 MG IMPL implant 1 each by Subdermal route once.     lisdexamfetamine (VYVANSE ) 50 MG capsule Take 1 capsule (50 mg total) by mouth daily. 30 capsule 0   lisdexamfetamine (VYVANSE ) 50  MG capsule Take 1 capsule (50 mg total) by mouth daily. 30 capsule 0   lisdexamfetamine (VYVANSE ) 50 MG capsule Take 1 capsule (50 mg total) by mouth daily. 30 capsule 0   MAGNESIUM CITRATE PO Take by mouth at bedtime as needed.     Multiple Vitamins-Minerals (WHOLE FOOD MULTIVITAMIN PO) Take by mouth.     spironolactone (ALDACTONE) 50 MG tablet Take 50 mg by mouth daily.     UNABLE TO FIND Med Name: Calm pills as needed (Magnesium, Ashwaghanda, Melatonin, L-threonate)     No current facility-administered medications for this visit.    Medication Side Effects: None  Allergies: No Known Allergies  Past Medical History:  Diagnosis Date   Endometriosis    Vitamin D deficiency     Family History  Problem Relation Age of Onset   Depression Mother    Sleep apnea Father    Diabetes Father    Heart attack Father    Anxiety disorder Sister    Depression Sister    Sleep apnea Sister     Social History   Socioeconomic History   Marital status: Single    Spouse name: Not on file   Number of children: Not on file   Years of education: Not on file   Highest education level: Not on file  Occupational History   Not on file  Tobacco Use   Smoking status: Never   Smokeless tobacco: Never  Substance and Sexual Activity   Alcohol use: Yes    Comment: occ   Drug use: No   Sexual activity: Not on file  Other Topics Concern   Not on file  Social History Narrative   Not on file   Social Drivers of Health   Financial Resource Strain: Not on file  Food Insecurity: Not on file  Transportation Needs: Not on file  Physical Activity: Not on file  Stress: Not on file  Social Connections: Not on file  Intimate Partner Violence: Not on file    Past Medical History, Surgical history, Social history, and Family history were reviewed and updated as appropriate.   Please see review of systems for further details on the patient's review from today.   Objective:   Physical Exam:   There were no vitals taken for this visit.  Physical Exam Constitutional:      General: She is not in acute distress. Musculoskeletal:        General: No deformity.  Neurological:     Mental Status: She is alert and oriented to person, place, and time.     Coordination: Coordination normal.  Psychiatric:        Attention and Perception: Attention and perception normal. She does not perceive auditory or visual hallucinations.        Mood and Affect: Mood normal. Mood is not anxious or depressed. Affect is not labile, blunt, angry or inappropriate.        Speech: Speech normal.        Behavior: Behavior normal.        Thought Content: Thought content normal. Thought content is not paranoid  or delusional. Thought content does not include homicidal or suicidal ideation. Thought content does not include homicidal or suicidal plan.        Cognition and Memory: Cognition and memory normal.        Judgment: Judgment normal.     Comments: Insight intact     Lab Review:  No results found for: NA, K, CL, CO2, GLUCOSE, BUN, CREATININE, CALCIUM, PROT, ALBUMIN, AST, ALT, ALKPHOS, BILITOT, GFRNONAA, GFRAA  No results found for: WBC, RBC, HGB, HCT, PLT, MCV, MCH, MCHC, RDW, LYMPHSABS, MONOABS, EOSABS, BASOSABS  No results found for: POCLITH, LITHIUM   No results found for: PHENYTOIN, PHENOBARB, VALPROATE, CBMZ   .res Assessment: Plan:    Plan:  Restart Wellbutrin  XL 150mg  daily x 14 days - then 300 mg daily for depression.  Increased Celexa  30mg  to 40mg  daily for anxiety and depression.  Restart Vyvanse  30 mg daily for ADHD.   Consider Buspar  15 mg daily for anxiety - next visit.   Pt to follow-up in 4 weeks or sooner if clinically indicated.   40 minutes spent dedicated to the care of this patient on the date of this encounter to include pre-visit review of records, ordering of medication, post visit documentation,  and face-to-face time with the patient discussing depression, anxiety and ADD. Discussed restarting previous medications.    There are no diagnoses linked to this encounter.   Please see After Visit Summary for patient specific instructions.  No future appointments.  No orders of the defined types were placed in this encounter.     -------------------------------

## 2023-12-12 ENCOUNTER — Telehealth: Admitting: Adult Health

## 2023-12-12 ENCOUNTER — Encounter: Payer: Self-pay | Admitting: Adult Health

## 2023-12-12 DIAGNOSIS — F900 Attention-deficit hyperactivity disorder, predominantly inattentive type: Secondary | ICD-10-CM

## 2023-12-12 DIAGNOSIS — F419 Anxiety disorder, unspecified: Secondary | ICD-10-CM

## 2023-12-12 DIAGNOSIS — F32A Depression, unspecified: Secondary | ICD-10-CM

## 2023-12-12 DIAGNOSIS — F339 Major depressive disorder, recurrent, unspecified: Secondary | ICD-10-CM

## 2023-12-12 DIAGNOSIS — F411 Generalized anxiety disorder: Secondary | ICD-10-CM

## 2023-12-12 MED ORDER — LISDEXAMFETAMINE DIMESYLATE 30 MG PO CAPS: 30.0000 mg | ORAL_CAPSULE | Freq: Every day | ORAL | 0 refills | Status: DC

## 2023-12-12 MED ORDER — LISDEXAMFETAMINE DIMESYLATE 30 MG PO CAPS
30.0000 mg | ORAL_CAPSULE | Freq: Every day | ORAL | 0 refills | Status: DC
Start: 2023-12-12 — End: 2024-03-11

## 2023-12-12 NOTE — Progress Notes (Signed)
 Anna Obrien [id number] [birth year] 33 y.o.  Virtual Visit via Video Note  I connected with pt @ on 12/12/23 at  5:30 PM EDT by a video enabled telemedicine application and verified that I am speaking with the correct person using two identifiers.   I discussed the limitations of evaluation and management by telemedicine and the availability of in person appointments. The patient expressed understanding and agreed to proceed.  I discussed the assessment and treatment plan with the patient. The patient was provided an opportunity to ask questions and all were answered. The patient agreed with the plan and demonstrated an understanding of the instructions.   The patient was advised to call back or seek an in-person evaluation if the symptoms worsen or if the condition fails to improve as anticipated.  I provided 25 minutes of non-face-to-face time during this encounter.  The patient was located at home.  The provider was located at [facility].   Anna LOISE Sayers, NP   Subjective:   [name] is a 33 y.o. (DOB 02-05-1991) female.  Chief Complaint: No chief complaint on file.   HPI Anna Obrien presents for follow-up of depression, anxiety, and ADHD.   Describes mood today as better. Pleasant. Denies tearfulness. Mood symptoms - denies depression. Reports improved interest and motivation. Getting more things done around the house. Reports some anxiety - mostly at work. Denies irritability. Reports some worry rumination and over thinking. Denies obsessive thoughts and acts. Reports mood is stable. Stating I feel like I'm doing better. Taking medications as prescribed.  Energy levels improved. Active, working regular exercise routine. Enjoys some usual interests and activities. Lives alone - she and boyfriend staying together - he lives in [place]. Spending time with family and friends. Appetite adequate. Reports weight stable - maintaining. Sleeps well most  nights. Averages 7 to 8 hours. Focus and concentration improved Completing tasks. Managing aspects of household. Working full time - [employer] - 40 to 50 hours. Denies SI or HI.  Denies AH or VH. Denies self harm. Denies substance use.  Working with a therapist - Anna Obrien.  Past Psychiatric Medication Trials: Celexa - starting in early 2019 on 20 mg qd. Improved sleep and PMDD s/s. Unsure how helpful it has been for depression and anxiety in general. Trazodone- Excessive somnolence Melatonin- Nightmares OTC sleep aids- ineffective   Review of Systems:  Review of Systems  Musculoskeletal:  Negative for gait problem.  Neurological:  Negative for tremors.  Psychiatric/Behavioral:         Please refer to HPI    Medications: I have reviewed the patient's current medications.  Current Outpatient Medications  Medication Sig Dispense Refill   buPROPion  (WELLBUTRIN  XL) 150 MG 24 hr tablet Take 1 tablet (150 mg total) by mouth daily. 14 tablet 0   buPROPion  (WELLBUTRIN  XL) 300 MG 24 hr tablet Take 1 tablet (300 mg total) by mouth daily. 90 tablet 1   busPIRone  (BUSPAR ) 15 MG tablet Take 1 tablet (15 mg total) by mouth daily. 90 tablet 1   cholecalciferol (VITAMIN D3) 25 MCG (1000 UNIT) tablet Take 2,000 Units by mouth daily.      citalopram  (CELEXA ) 40 MG tablet Take one tablet daily. 90 tablet 0   etonogestrel (NEXPLANON) 68 MG IMPL implant 1 each by Subdermal route once.     lisdexamfetamine (VYVANSE ) 30 MG capsule Take 1 capsule (30 mg total) by mouth daily. 30 capsule 0   lisdexamfetamine (VYVANSE ) 50 MG capsule Take 1 capsule (50  mg total) by mouth daily. 30 capsule 0   lisdexamfetamine (VYVANSE ) 50 MG capsule Take 1 capsule (50 mg total) by mouth daily. 30 capsule 0   lisdexamfetamine (VYVANSE ) 50 MG capsule Take 1 capsule (50 mg total) by mouth daily. 30 capsule 0   MAGNESIUM CITRATE PO Take by mouth at bedtime as needed.     Multiple Vitamins-Minerals (WHOLE FOOD MULTIVITAMIN  PO) Take by mouth.     spironolactone (ALDACTONE) 50 MG tablet Take 50 mg by mouth daily.     UNABLE TO FIND Med Name: Calm pills as needed (Magnesium, Ashwaghanda, Melatonin, L-threonate)     No current facility-administered medications for this visit.    Medication Side Effects: None  Allergies: No Known Allergies  Past Medical History:  Diagnosis Date   Endometriosis    Vitamin D deficiency     Family History  Problem Relation Age of Onset   Depression Mother    Sleep apnea Father    Diabetes Father    Heart attack Father    Anxiety disorder Sister    Depression Sister    Sleep apnea Sister     Social History   Socioeconomic History   Marital status: Single    Spouse name: Not on file   Number of children: Not on file   Years of education: Not on file   Highest education level: Not on file  Occupational History   Not on file  Tobacco Use   Smoking status: Never   Smokeless tobacco: Never  Substance and Sexual Activity   Alcohol use: Yes    Comment: occ   Drug use: No   Sexual activity: Not on file  Other Topics Concern   Not on file  Social History Narrative   Not on file   Social Drivers of Health   Financial Resource Strain: Not on file  Food Insecurity: Not on file  Transportation Needs: Not on file  Physical Activity: Not on file  Stress: Not on file  Social Connections: Not on file  Intimate Partner Violence: Not on file    Past Medical History, Surgical history, Social history, and Family history were reviewed and updated as appropriate.   Please see review of systems for further details on the patient's review from today.   Objective:   Physical Exam:  There were no vitals taken for this visit.  Physical Exam Constitutional:      General: She is not in acute distress. Musculoskeletal:        General: No deformity.  Neurological:     Mental Status: She is alert and oriented to person, place, and time.     Coordination:  Coordination normal.  Psychiatric:        Attention and Perception: Attention and perception normal. She does not perceive auditory or visual hallucinations.        Mood and Affect: Mood normal. Mood is not anxious or depressed. Affect is not labile, blunt, angry or inappropriate.        Speech: Speech normal.        Behavior: Behavior normal.        Thought Content: Thought content normal. Thought content is not paranoid or delusional. Thought content does not include homicidal or suicidal ideation. Thought content does not include homicidal or suicidal plan.        Cognition and Memory: Cognition and memory normal.        Judgment: Judgment normal.     Comments: Insight intact  Lab Review:  No results found for: NA, K, CL, CO2, GLUCOSE, BUN, CREATININE, CALCIUM, PROT, ALBUMIN, AST, ALT, ALKPHOS, BILITOT, GFRNONAA, GFRAA  No results found for: WBC, RBC, HGB, HCT, PLT, MCV, MCH, MCHC, RDW, LYMPHSABS, MONOABS, EOSABS, BASOSABS  No results found for: POCLITH, LITHIUM   No results found for: PHENYTOIN, PHENOBARB, VALPROATE, CBMZ   .res Assessment: Plan:    Plan:  Wellbutrin  XL 300 mg daily for depression.  Celexa  40mg  daily for anxiety and depression - may be a little too numbing, but will give it another month before aking changes Vyvanse  30 mg daily for ADHD.   Consider Buspar  15 mg daily for anxiety - next visit.   Pt to follow-up in 4/6 weeks or sooner if clinically indicated.   15 minutes spent dedicated to the care of this patient on the date of this encounter to include pre-visit review of records, ordering of medication, post visit documentation, and face-to-face time with the patient discussing depression, anxiety and ADD. Discussed restarting previous medications.    There are no diagnoses linked to this encounter.   Please see After Visit Summary for patient specific instructions.  Future  Appointments  Date Time Provider Department Center  12/12/2023  5:30 PM Jalila Goodnough Nattalie, NP [facility] None    No orders of the defined types were placed in this encounter.     -------------------------------

## 2024-03-08 ENCOUNTER — Other Ambulatory Visit: Payer: Self-pay | Admitting: Adult Health

## 2024-03-08 DIAGNOSIS — F411 Generalized anxiety disorder: Secondary | ICD-10-CM

## 2024-03-11 ENCOUNTER — Ambulatory Visit: Admitting: Adult Health

## 2024-03-11 ENCOUNTER — Encounter: Payer: Self-pay | Admitting: Adult Health

## 2024-03-11 DIAGNOSIS — F909 Attention-deficit hyperactivity disorder, unspecified type: Secondary | ICD-10-CM

## 2024-03-11 DIAGNOSIS — F900 Attention-deficit hyperactivity disorder, predominantly inattentive type: Secondary | ICD-10-CM

## 2024-03-11 DIAGNOSIS — F32A Depression, unspecified: Secondary | ICD-10-CM

## 2024-03-11 DIAGNOSIS — F411 Generalized anxiety disorder: Secondary | ICD-10-CM

## 2024-03-11 DIAGNOSIS — F419 Anxiety disorder, unspecified: Secondary | ICD-10-CM

## 2024-03-11 DIAGNOSIS — F339 Major depressive disorder, recurrent, unspecified: Secondary | ICD-10-CM

## 2024-03-11 MED ORDER — LISDEXAMFETAMINE DIMESYLATE 30 MG PO CAPS: 30.0000 mg | ORAL_CAPSULE | Freq: Every day | ORAL | 0 refills | Status: AC

## 2024-03-11 MED ORDER — CITALOPRAM HYDROBROMIDE 40 MG PO TABS: 40.0000 mg | ORAL_TABLET | Freq: Every day | ORAL | 1 refills | Status: AC

## 2024-03-11 MED ORDER — LISDEXAMFETAMINE DIMESYLATE 50 MG PO CAPS: 50.0000 mg | ORAL_CAPSULE | Freq: Every day | ORAL | 0 refills | Status: AC

## 2024-03-11 MED ORDER — LISDEXAMFETAMINE DIMESYLATE 30 MG PO CAPS
30.0000 mg | ORAL_CAPSULE | Freq: Every day | ORAL | 0 refills | Status: AC
Start: 2024-03-11 — End: ?

## 2024-03-11 NOTE — Progress Notes (Signed)
 Anna Obrien [id number] 04-04-1991 33 y.o.  Virtual Visit via Telephone Note  I connected with pt on 03/11/24 at  9:00 AM EST by telephone and verified that I am speaking with the correct person using two identifiers.   I discussed the limitations, risks, security and privacy concerns of performing an evaluation and management service by telephone and the availability of in person appointments. I also discussed with the patient that there may be a patient responsible charge related to this service. The patient expressed understanding and agreed to proceed.   I discussed the assessment and treatment plan with the patient. The patient was provided an opportunity to ask questions and all were answered. The patient agreed with the plan and demonstrated an understanding of the instructions.   The patient was advised to call back or seek an in-person evaluation if the symptoms worsen or if the condition fails to improve as anticipated.  I provided 25 minutes of non-face-to-face time during this encounter.  The patient was located at home.  The provider was located at [facility].   Angeline LOISE Sayers, NP   Subjective:   [name] is a 33 y.o. (DOB 1991/01/17) female.  Chief Complaint: No chief complaint on file.   HPI Anna Obrien presents for follow-up of depression, anxiety, and ADHD.   Describes mood today as better. Pleasant. Denies tearfulness. Mood symptoms - reports situational depression. Reports improved interest and motivation. Denies anxiety and irritability. Reports some worry rumination and over thinking - a little less than before. Denies obsessive thoughts and acts. Reports mood is stable. Stating I feel like I'm doing ok. Taking medications as prescribed.  Energy levels improved. Active, has a regular exercise routine. Enjoys some usual interests and activities. Lives alone - she and boyfriend staying together - he lives in [place]. Spending  time with family and friends. Appetite adequate. Reports weight loss - recently started Zepbound. Sleeps well most nights. Averages 7 to 8 hours. Focus and concentration stable. Completing tasks. Managing aspects of household. Working full time - [employer] - 40 to 50 hours. Denies SI or HI.  Denies AH or VH. Denies self harm. Denies substance use.  Working with a therapist - Manuelita Keas.  Past Psychiatric Medication Trials: Celexa - starting in early 2019 on 20 mg qd. Improved sleep and PMDD s/s. Unsure how helpful it has been for depression and anxiety in general. Trazodone- Excessive somnolence Melatonin- Nightmares OTC sleep aids- ineffective    Review of Systems:  Review of Systems  Musculoskeletal:  Negative for gait problem.  Neurological:  Negative for tremors.  Psychiatric/Behavioral:         Please refer to HPI    Medications: I have reviewed the patient's current medications.  Current Outpatient Medications  Medication Sig Dispense Refill   buPROPion  (WELLBUTRIN  XL) 150 MG 24 hr tablet Take 1 tablet (150 mg total) by mouth daily. 14 tablet 0   buPROPion  (WELLBUTRIN  XL) 300 MG 24 hr tablet Take 1 tablet (300 mg total) by mouth daily. 90 tablet 1   busPIRone  (BUSPAR ) 15 MG tablet Take 1 tablet (15 mg total) by mouth daily. 90 tablet 1   cholecalciferol (VITAMIN D3) 25 MCG (1000 UNIT) tablet Take 2,000 Units by mouth daily.      citalopram  (CELEXA ) 40 MG tablet TAKE ONE TABLET BY MOUTH ONCE A DAY 30 tablet 0   etonogestrel (NEXPLANON) 68 MG IMPL implant 1 each by Subdermal route once.     lisdexamfetamine (VYVANSE ) 30  MG capsule Take 1 capsule (30 mg total) by mouth daily. 30 capsule 0   lisdexamfetamine (VYVANSE ) 30 MG capsule Take 1 capsule (30 mg total) by mouth daily. 30 capsule 0   lisdexamfetamine (VYVANSE ) 50 MG capsule Take 1 capsule (50 mg total) by mouth daily. 30 capsule 0   lisdexamfetamine (VYVANSE ) 50 MG capsule Take 1 capsule (50 mg total) by mouth daily.  30 capsule 0   MAGNESIUM CITRATE PO Take by mouth at bedtime as needed.     Multiple Vitamins-Minerals (WHOLE FOOD MULTIVITAMIN PO) Take by mouth.     spironolactone (ALDACTONE) 50 MG tablet Take 50 mg by mouth daily.     UNABLE TO FIND Med Name: Calm pills as needed (Magnesium, Ashwaghanda, Melatonin, L-threonate)     No current facility-administered medications for this visit.    Medication Side Effects: None  Allergies: No Known Allergies  Past Medical History:  Diagnosis Date   Endometriosis    Vitamin D deficiency     Family History  Problem Relation Age of Onset   Depression Mother    Sleep apnea Father    Diabetes Father    Heart attack Father    Anxiety disorder Sister    Depression Sister    Sleep apnea Sister     Social History   Socioeconomic History   Marital status: Single    Spouse name: Not on file   Number of children: Not on file   Years of education: Not on file   Highest education level: Not on file  Occupational History   Not on file  Tobacco Use   Smoking status: Never   Smokeless tobacco: Never  Substance and Sexual Activity   Alcohol use: Yes    Comment: occ   Drug use: No   Sexual activity: Not on file  Other Topics Concern   Not on file  Social History Narrative   Not on file   Social Drivers of Health   Financial Resource Strain: Not on file  Food Insecurity: Not on file  Transportation Needs: Not on file  Physical Activity: Not on file  Stress: Not on file  Social Connections: Not on file  Intimate Partner Violence: Not on file    Past Medical History, Surgical history, Social history, and Family history were reviewed and updated as appropriate.   Please see review of systems for further details on the patient's review from today.   Objective:   Physical Exam:  There were no vitals taken for this visit.  Physical Exam Constitutional:      General: She is not in acute distress. Musculoskeletal:        General: No  deformity.  Neurological:     Mental Status: She is alert and oriented to person, place, and time.     Coordination: Coordination normal.  Psychiatric:        Attention and Perception: Attention and perception normal. She does not perceive auditory or visual hallucinations.        Mood and Affect: Mood normal. Mood is not anxious or depressed. Affect is not labile, blunt, angry or inappropriate.        Speech: Speech normal.        Behavior: Behavior normal.        Thought Content: Thought content normal. Thought content is not paranoid or delusional. Thought content does not include homicidal or suicidal ideation. Thought content does not include homicidal or suicidal plan.        Cognition  and Memory: Cognition and memory normal.        Judgment: Judgment normal.     Comments: Insight intact     Lab Review:  No results found for: NA, K, CL, CO2, GLUCOSE, BUN, CREATININE, CALCIUM, PROT, ALBUMIN, AST, ALT, ALKPHOS, BILITOT, GFRNONAA, GFRAA  No results found for: WBC, RBC, HGB, HCT, PLT, MCV, MCH, MCHC, RDW, LYMPHSABS, MONOABS, EOSABS, BASOSABS  No results found for: POCLITH, LITHIUM   No results found for: PHENYTOIN, PHENOBARB, VALPROATE, CBMZ   .res Assessment: Plan:    Plan:  Wellbutrin  XL 300 mg daily for depression.  Celexa  40mg  daily for anxiety and depression  Vyvanse  30 mg daily for ADHD.   Consider Buspar  15 mg daily for anxiety.  Pt to follow-up in 3 months or sooner if clinically indicated.   15 minutes spent dedicated to the care of this patient on the date of this encounter to include pre-visit review of records, ordering of medication, post visit documentation, and face-to-face time with the patient discussing depression, anxiety and ADD. Discussed restarting previous medications.    There are no diagnoses linked to this encounter.  Please see After Visit Summary for patient specific  instructions.  Future Appointments  Date Time Provider Department Center  03/11/2024  9:00 AM Datrell Dunton Nattalie, NP [facility] None    No orders of the defined types were placed in this encounter.     -------------------------------

## 2024-06-11 ENCOUNTER — Telehealth: Admitting: Adult Health
